# Patient Record
Sex: Female | Born: 1980 | Race: White | Hispanic: No | Marital: Married | Smoking: Current every day smoker
Health system: Southern US, Community
[De-identification: ages and names within clinical notes are randomized; demographics above are authoritative.]

## PROBLEM LIST (undated history)

## (undated) ENCOUNTER — Emergency Department (HOSPITAL_BASED_OUTPATIENT_CLINIC_OR_DEPARTMENT_OTHER): Payer: Medicaid Other

## (undated) DIAGNOSIS — E049 Nontoxic goiter, unspecified: Secondary | ICD-10-CM

## (undated) DIAGNOSIS — K115 Sialolithiasis: Secondary | ICD-10-CM

## (undated) DIAGNOSIS — D649 Anemia, unspecified: Secondary | ICD-10-CM

## (undated) DIAGNOSIS — E039 Hypothyroidism, unspecified: Secondary | ICD-10-CM

## (undated) DIAGNOSIS — G8929 Other chronic pain: Secondary | ICD-10-CM

## (undated) DIAGNOSIS — K635 Polyp of colon: Secondary | ICD-10-CM

## (undated) DIAGNOSIS — F329 Major depressive disorder, single episode, unspecified: Secondary | ICD-10-CM

## (undated) DIAGNOSIS — R42 Dizziness and giddiness: Secondary | ICD-10-CM

## (undated) DIAGNOSIS — F32A Depression, unspecified: Secondary | ICD-10-CM

## (undated) DIAGNOSIS — M549 Dorsalgia, unspecified: Secondary | ICD-10-CM

## (undated) DIAGNOSIS — F429 Obsessive-compulsive disorder, unspecified: Secondary | ICD-10-CM

## (undated) HISTORY — PX: ABDOMINAL HYSTERECTOMY: SHX81

## (undated) HISTORY — DX: Polyp of colon: K63.5

## (undated) HISTORY — DX: Hypothyroidism, unspecified: E03.9

## (undated) HISTORY — PX: WISDOM TOOTH EXTRACTION: SHX21

---

## 1999-12-14 ENCOUNTER — Inpatient Hospital Stay (HOSPITAL_COMMUNITY): Admission: AD | Admit: 1999-12-14 | Discharge: 1999-12-14 | Payer: Self-pay | Admitting: Obstetrics & Gynecology

## 2000-01-13 ENCOUNTER — Other Ambulatory Visit: Admission: RE | Admit: 2000-01-13 | Discharge: 2000-01-13 | Payer: Self-pay | Admitting: Obstetrics and Gynecology

## 2000-03-18 ENCOUNTER — Ambulatory Visit (HOSPITAL_COMMUNITY): Admission: RE | Admit: 2000-03-18 | Discharge: 2000-03-18 | Payer: Self-pay | Admitting: *Deleted

## 2000-04-16 ENCOUNTER — Inpatient Hospital Stay (HOSPITAL_COMMUNITY): Admission: AD | Admit: 2000-04-16 | Discharge: 2000-04-16 | Payer: Self-pay | Admitting: *Deleted

## 2000-05-03 ENCOUNTER — Inpatient Hospital Stay (HOSPITAL_COMMUNITY): Admission: AD | Admit: 2000-05-03 | Discharge: 2000-05-03 | Payer: Self-pay | Admitting: *Deleted

## 2000-05-13 ENCOUNTER — Ambulatory Visit (HOSPITAL_COMMUNITY): Admission: RE | Admit: 2000-05-13 | Discharge: 2000-05-13 | Payer: Self-pay | Admitting: *Deleted

## 2000-06-01 ENCOUNTER — Inpatient Hospital Stay (HOSPITAL_COMMUNITY): Admission: AD | Admit: 2000-06-01 | Discharge: 2000-06-01 | Payer: Self-pay | Admitting: Obstetrics

## 2000-06-02 ENCOUNTER — Inpatient Hospital Stay (HOSPITAL_COMMUNITY): Admission: AD | Admit: 2000-06-02 | Discharge: 2000-06-02 | Payer: Self-pay | Admitting: Obstetrics

## 2000-06-02 ENCOUNTER — Inpatient Hospital Stay (HOSPITAL_COMMUNITY): Admission: AD | Admit: 2000-06-02 | Discharge: 2000-06-02 | Payer: Self-pay | Admitting: Obstetrics & Gynecology

## 2000-06-05 ENCOUNTER — Inpatient Hospital Stay (HOSPITAL_COMMUNITY): Admission: AD | Admit: 2000-06-05 | Discharge: 2000-06-05 | Payer: Self-pay | Admitting: Obstetrics

## 2000-07-26 ENCOUNTER — Inpatient Hospital Stay (HOSPITAL_COMMUNITY): Admission: AD | Admit: 2000-07-26 | Discharge: 2000-07-28 | Payer: Self-pay | Admitting: Obstetrics

## 2000-10-03 ENCOUNTER — Inpatient Hospital Stay (HOSPITAL_COMMUNITY): Admission: AD | Admit: 2000-10-03 | Discharge: 2000-10-03 | Payer: Self-pay | Admitting: Obstetrics & Gynecology

## 2001-01-04 ENCOUNTER — Emergency Department (HOSPITAL_COMMUNITY): Admission: EM | Admit: 2001-01-04 | Discharge: 2001-01-04 | Payer: Self-pay | Admitting: Emergency Medicine

## 2001-01-10 ENCOUNTER — Other Ambulatory Visit: Admission: RE | Admit: 2001-01-10 | Discharge: 2001-01-10 | Payer: Self-pay | Admitting: *Deleted

## 2001-03-29 ENCOUNTER — Other Ambulatory Visit: Admission: RE | Admit: 2001-03-29 | Discharge: 2001-03-29 | Payer: Self-pay | Admitting: Obstetrics and Gynecology

## 2001-04-15 ENCOUNTER — Emergency Department (HOSPITAL_COMMUNITY): Admission: EM | Admit: 2001-04-15 | Discharge: 2001-04-15 | Payer: Self-pay | Admitting: Emergency Medicine

## 2001-04-21 ENCOUNTER — Ambulatory Visit (HOSPITAL_COMMUNITY): Admission: RE | Admit: 2001-04-21 | Discharge: 2001-04-21 | Payer: Self-pay | Admitting: Obstetrics and Gynecology

## 2001-04-21 HISTORY — PX: LASER ABLATION OF THE CERVIX: SHX1949

## 2001-04-28 ENCOUNTER — Encounter: Payer: Self-pay | Admitting: Emergency Medicine

## 2001-04-28 ENCOUNTER — Inpatient Hospital Stay (HOSPITAL_COMMUNITY): Admission: AC | Admit: 2001-04-28 | Discharge: 2001-04-30 | Payer: Self-pay

## 2001-08-25 ENCOUNTER — Emergency Department (HOSPITAL_COMMUNITY): Admission: EM | Admit: 2001-08-25 | Discharge: 2001-08-25 | Payer: Self-pay | Admitting: Emergency Medicine

## 2002-01-01 ENCOUNTER — Emergency Department (HOSPITAL_COMMUNITY): Admission: EM | Admit: 2002-01-01 | Discharge: 2002-01-02 | Payer: Self-pay | Admitting: Emergency Medicine

## 2002-01-03 ENCOUNTER — Emergency Department (HOSPITAL_COMMUNITY): Admission: EM | Admit: 2002-01-03 | Discharge: 2002-01-03 | Payer: Self-pay | Admitting: Emergency Medicine

## 2002-01-23 ENCOUNTER — Emergency Department (HOSPITAL_COMMUNITY): Admission: EM | Admit: 2002-01-23 | Discharge: 2002-01-23 | Payer: Self-pay | Admitting: Emergency Medicine

## 2002-03-15 ENCOUNTER — Emergency Department (HOSPITAL_COMMUNITY): Admission: EM | Admit: 2002-03-15 | Discharge: 2002-03-15 | Payer: Self-pay | Admitting: Emergency Medicine

## 2002-04-09 ENCOUNTER — Encounter: Admission: RE | Admit: 2002-04-09 | Discharge: 2002-04-09 | Payer: Self-pay | Admitting: Internal Medicine

## 2002-06-23 ENCOUNTER — Emergency Department (HOSPITAL_COMMUNITY): Admission: EM | Admit: 2002-06-23 | Discharge: 2002-06-23 | Payer: Self-pay | Admitting: Emergency Medicine

## 2002-08-03 ENCOUNTER — Emergency Department (HOSPITAL_COMMUNITY): Admission: EM | Admit: 2002-08-03 | Discharge: 2002-08-03 | Payer: Self-pay | Admitting: *Deleted

## 2002-08-09 ENCOUNTER — Inpatient Hospital Stay (HOSPITAL_COMMUNITY): Admission: AD | Admit: 2002-08-09 | Discharge: 2002-08-09 | Payer: Self-pay | Admitting: *Deleted

## 2002-11-24 ENCOUNTER — Emergency Department (HOSPITAL_COMMUNITY): Admission: EM | Admit: 2002-11-24 | Discharge: 2002-11-25 | Payer: Self-pay | Admitting: Emergency Medicine

## 2002-11-25 ENCOUNTER — Encounter: Payer: Self-pay | Admitting: Emergency Medicine

## 2003-03-21 ENCOUNTER — Emergency Department (HOSPITAL_COMMUNITY): Admission: EM | Admit: 2003-03-21 | Discharge: 2003-03-21 | Payer: Self-pay | Admitting: Emergency Medicine

## 2003-06-14 ENCOUNTER — Inpatient Hospital Stay (HOSPITAL_COMMUNITY): Admission: AD | Admit: 2003-06-14 | Discharge: 2003-06-14 | Payer: Self-pay | Admitting: *Deleted

## 2003-06-26 ENCOUNTER — Other Ambulatory Visit: Admission: RE | Admit: 2003-06-26 | Discharge: 2003-06-26 | Payer: Self-pay | Admitting: Obstetrics and Gynecology

## 2003-07-05 ENCOUNTER — Inpatient Hospital Stay (HOSPITAL_COMMUNITY): Admission: AD | Admit: 2003-07-05 | Discharge: 2003-07-05 | Payer: Self-pay | Admitting: Obstetrics and Gynecology

## 2003-08-31 ENCOUNTER — Inpatient Hospital Stay (HOSPITAL_COMMUNITY): Admission: AD | Admit: 2003-08-31 | Discharge: 2003-08-31 | Payer: Self-pay | Admitting: Obstetrics & Gynecology

## 2003-10-17 ENCOUNTER — Inpatient Hospital Stay (HOSPITAL_COMMUNITY): Admission: AD | Admit: 2003-10-17 | Discharge: 2003-10-17 | Payer: Self-pay | Admitting: Obstetrics and Gynecology

## 2003-11-17 ENCOUNTER — Inpatient Hospital Stay (HOSPITAL_COMMUNITY): Admission: AD | Admit: 2003-11-17 | Discharge: 2003-11-17 | Payer: Self-pay | Admitting: Obstetrics and Gynecology

## 2003-12-19 ENCOUNTER — Inpatient Hospital Stay (HOSPITAL_COMMUNITY): Admission: AD | Admit: 2003-12-19 | Discharge: 2003-12-21 | Payer: Self-pay | Admitting: Obstetrics and Gynecology

## 2003-12-23 ENCOUNTER — Inpatient Hospital Stay (HOSPITAL_COMMUNITY): Admission: AD | Admit: 2003-12-23 | Discharge: 2003-12-23 | Payer: Self-pay | Admitting: Obstetrics and Gynecology

## 2004-01-13 ENCOUNTER — Observation Stay (HOSPITAL_COMMUNITY): Admission: AD | Admit: 2004-01-13 | Discharge: 2004-01-13 | Payer: Self-pay | Admitting: Obstetrics and Gynecology

## 2004-01-29 ENCOUNTER — Inpatient Hospital Stay (HOSPITAL_COMMUNITY): Admission: AD | Admit: 2004-01-29 | Discharge: 2004-01-31 | Payer: Self-pay | Admitting: Obstetrics and Gynecology

## 2005-02-06 ENCOUNTER — Emergency Department (HOSPITAL_COMMUNITY): Admission: EM | Admit: 2005-02-06 | Discharge: 2005-02-06 | Payer: Self-pay | Admitting: *Deleted

## 2005-05-20 ENCOUNTER — Other Ambulatory Visit: Admission: RE | Admit: 2005-05-20 | Discharge: 2005-05-20 | Payer: Self-pay | Admitting: Obstetrics and Gynecology

## 2005-07-29 ENCOUNTER — Other Ambulatory Visit: Admission: RE | Admit: 2005-07-29 | Discharge: 2005-07-29 | Payer: Self-pay | Admitting: Obstetrics and Gynecology

## 2005-10-04 ENCOUNTER — Encounter: Admission: RE | Admit: 2005-10-04 | Discharge: 2005-10-04 | Payer: Self-pay | Admitting: Nephrology

## 2005-11-11 ENCOUNTER — Other Ambulatory Visit: Admission: RE | Admit: 2005-11-11 | Discharge: 2005-11-11 | Payer: Self-pay | Admitting: Obstetrics and Gynecology

## 2005-12-05 ENCOUNTER — Emergency Department (HOSPITAL_COMMUNITY): Admission: EM | Admit: 2005-12-05 | Discharge: 2005-12-05 | Payer: Self-pay | Admitting: Emergency Medicine

## 2006-05-14 ENCOUNTER — Emergency Department (HOSPITAL_COMMUNITY): Admission: EM | Admit: 2006-05-14 | Discharge: 2006-05-14 | Payer: Self-pay | Admitting: Emergency Medicine

## 2006-06-03 ENCOUNTER — Other Ambulatory Visit: Admission: RE | Admit: 2006-06-03 | Discharge: 2006-06-03 | Payer: Self-pay | Admitting: Obstetrics and Gynecology

## 2007-07-15 ENCOUNTER — Inpatient Hospital Stay (HOSPITAL_COMMUNITY): Admission: AD | Admit: 2007-07-15 | Discharge: 2007-07-15 | Payer: Self-pay | Admitting: Obstetrics and Gynecology

## 2007-10-29 ENCOUNTER — Inpatient Hospital Stay (HOSPITAL_COMMUNITY): Admission: AD | Admit: 2007-10-29 | Discharge: 2007-10-29 | Payer: Self-pay | Admitting: Obstetrics and Gynecology

## 2007-11-18 ENCOUNTER — Inpatient Hospital Stay (HOSPITAL_COMMUNITY): Admission: AD | Admit: 2007-11-18 | Discharge: 2007-11-18 | Payer: Self-pay | Admitting: Obstetrics and Gynecology

## 2007-12-29 ENCOUNTER — Inpatient Hospital Stay (HOSPITAL_COMMUNITY): Admission: AD | Admit: 2007-12-29 | Discharge: 2007-12-30 | Payer: Self-pay | Admitting: Obstetrics and Gynecology

## 2008-01-29 ENCOUNTER — Inpatient Hospital Stay (HOSPITAL_COMMUNITY): Admission: AD | Admit: 2008-01-29 | Discharge: 2008-01-29 | Payer: Self-pay | Admitting: Obstetrics and Gynecology

## 2008-02-16 ENCOUNTER — Inpatient Hospital Stay (HOSPITAL_COMMUNITY): Admission: RE | Admit: 2008-02-16 | Discharge: 2008-02-18 | Payer: Self-pay | Admitting: Obstetrics and Gynecology

## 2008-05-11 ENCOUNTER — Emergency Department (HOSPITAL_COMMUNITY): Admission: EM | Admit: 2008-05-11 | Discharge: 2008-05-12 | Payer: Self-pay | Admitting: Emergency Medicine

## 2008-09-27 ENCOUNTER — Emergency Department (HOSPITAL_COMMUNITY): Admission: EM | Admit: 2008-09-27 | Discharge: 2008-09-27 | Payer: Self-pay | Admitting: Emergency Medicine

## 2008-10-01 ENCOUNTER — Emergency Department (HOSPITAL_COMMUNITY): Admission: EM | Admit: 2008-10-01 | Discharge: 2008-10-01 | Payer: Self-pay | Admitting: Family Medicine

## 2008-10-21 ENCOUNTER — Ambulatory Visit (HOSPITAL_COMMUNITY): Payer: Self-pay | Admitting: Psychology

## 2008-11-21 ENCOUNTER — Emergency Department (HOSPITAL_COMMUNITY): Admission: EM | Admit: 2008-11-21 | Discharge: 2008-11-22 | Payer: Self-pay | Admitting: Emergency Medicine

## 2008-11-23 ENCOUNTER — Emergency Department (HOSPITAL_BASED_OUTPATIENT_CLINIC_OR_DEPARTMENT_OTHER): Admission: EM | Admit: 2008-11-23 | Discharge: 2008-11-23 | Payer: Self-pay | Admitting: Emergency Medicine

## 2008-11-23 ENCOUNTER — Ambulatory Visit: Payer: Self-pay | Admitting: Diagnostic Radiology

## 2008-11-25 ENCOUNTER — Ambulatory Visit (HOSPITAL_BASED_OUTPATIENT_CLINIC_OR_DEPARTMENT_OTHER): Admission: RE | Admit: 2008-11-25 | Discharge: 2008-11-25 | Payer: Self-pay | Admitting: Emergency Medicine

## 2008-11-25 ENCOUNTER — Ambulatory Visit: Payer: Self-pay | Admitting: Diagnostic Radiology

## 2008-12-10 ENCOUNTER — Emergency Department (HOSPITAL_COMMUNITY): Admission: EM | Admit: 2008-12-10 | Discharge: 2008-12-10 | Payer: Self-pay | Admitting: Emergency Medicine

## 2009-01-16 ENCOUNTER — Encounter: Admission: RE | Admit: 2009-01-16 | Discharge: 2009-01-16 | Payer: Self-pay | Admitting: Obstetrics and Gynecology

## 2009-01-22 ENCOUNTER — Emergency Department (HOSPITAL_BASED_OUTPATIENT_CLINIC_OR_DEPARTMENT_OTHER): Admission: EM | Admit: 2009-01-22 | Discharge: 2009-01-23 | Payer: Self-pay | Admitting: Emergency Medicine

## 2009-02-11 ENCOUNTER — Ambulatory Visit (HOSPITAL_COMMUNITY): Admission: RE | Admit: 2009-02-11 | Discharge: 2009-02-11 | Payer: Self-pay | Admitting: Obstetrics and Gynecology

## 2009-02-11 HISTORY — PX: TUBAL LIGATION: SHX77

## 2009-03-31 ENCOUNTER — Emergency Department (HOSPITAL_COMMUNITY): Admission: EM | Admit: 2009-03-31 | Discharge: 2009-03-31 | Payer: Self-pay | Admitting: Family Medicine

## 2010-03-01 ENCOUNTER — Emergency Department (HOSPITAL_COMMUNITY): Admission: EM | Admit: 2010-03-01 | Discharge: 2010-03-01 | Payer: Self-pay | Admitting: Emergency Medicine

## 2010-05-22 ENCOUNTER — Emergency Department (HOSPITAL_COMMUNITY)
Admission: EM | Admit: 2010-05-22 | Discharge: 2010-05-23 | Payer: Self-pay | Source: Home / Self Care | Admitting: Emergency Medicine

## 2010-05-24 ENCOUNTER — Emergency Department (HOSPITAL_COMMUNITY): Admission: EM | Admit: 2010-05-24 | Discharge: 2010-05-24 | Payer: Self-pay | Admitting: Emergency Medicine

## 2010-11-12 LAB — DIFFERENTIAL
Lymphocytes Relative: 39 % (ref 12–46)
Lymphs Abs: 2.9 10*3/uL (ref 0.7–4.0)
Monocytes Relative: 5 % (ref 3–12)
Neutro Abs: 3.9 10*3/uL (ref 1.7–7.7)
Neutrophils Relative %: 52 % (ref 43–77)

## 2010-11-12 LAB — CBC
HCT: 37.9 % (ref 36.0–46.0)
MCV: 84.4 fL (ref 78.0–100.0)
Platelets: 200 10*3/uL (ref 150–400)
RBC: 4.49 MIL/uL (ref 3.87–5.11)
WBC: 7.4 10*3/uL (ref 4.0–10.5)

## 2010-11-12 LAB — BASIC METABOLIC PANEL
Calcium: 9.1 mg/dL (ref 8.4–10.5)
Chloride: 111 mEq/L (ref 96–112)
Creatinine, Ser: 0.6 mg/dL (ref 0.4–1.2)
GFR calc non Af Amer: >60 mL/min (ref >60)
Glucose, Bld: 101 mg/dL — ABNORMAL HIGH (ref 70–99)

## 2010-11-15 LAB — RAPID STREP SCREEN (MED CTR MEBANE ONLY): Streptococcus, Group A Screen (Direct): NEGATIVE

## 2010-12-07 LAB — CBC: Hemoglobin: 13.4 g/dL (ref 12.0–15.0)

## 2010-12-09 LAB — URINALYSIS, ROUTINE W REFLEX MICROSCOPIC
Bilirubin Urine: NEGATIVE
Glucose, UA: NEGATIVE mg/dL
Hgb urine dipstick: NEGATIVE
Protein, ur: NEGATIVE mg/dL
pH: 5.5 (ref 5.0–8.0)

## 2010-12-10 LAB — CBC
HCT: 42.4 % (ref 36.0–46.0)
Hemoglobin: 14.5 g/dL (ref 12.0–15.0)
MCV: 83.3 fL (ref 78.0–100.0)
Platelets: 217 10*3/uL (ref 150–400)
RBC: 5.09 MIL/uL (ref 3.87–5.11)
RDW: 12.5 % (ref 11.5–15.5)

## 2010-12-10 LAB — URINALYSIS, ROUTINE W REFLEX MICROSCOPIC
Hgb urine dipstick: NEGATIVE
Protein, ur: NEGATIVE mg/dL
Specific Gravity, Urine: 1.036 — ABNORMAL HIGH (ref 1.005–1.030)
pH: 6 (ref 5.0–8.0)

## 2010-12-10 LAB — COMPREHENSIVE METABOLIC PANEL
Albumin: 4.1 g/dL (ref 3.5–5.2)
BUN: 8 mg/dL (ref 6–23)
CO2: 21 mEq/L (ref 19–32)
Chloride: 107 mEq/L (ref 96–112)
Creatinine, Ser: 0.73 mg/dL (ref 0.4–1.2)
GFR calc non Af Amer: >60 mL/min (ref >60)
Potassium: 3.6 mEq/L (ref 3.5–5.1)
Sodium: 137 mEq/L (ref 135–145)
Total Protein: 6.9 g/dL (ref 6.0–8.3)

## 2010-12-10 LAB — URINE MICROSCOPIC-ADD ON

## 2010-12-10 LAB — DIFFERENTIAL
Basophils Absolute: 0.1 10*3/uL (ref 0.0–0.1)
Basophils Relative: 1 % (ref 0–1)
Eosinophils Absolute: 0.1 10*3/uL (ref 0.0–0.7)

## 2010-12-10 LAB — POCT PREGNANCY, URINE: Preg Test, Ur: NEGATIVE

## 2011-01-12 NOTE — H&P (Signed)
Patricia, Olson                  ACCOUNT NO.:  [id number]   MEDICAL RECORD NO.:  [id number]          PATIENT TYPE:  INP   LOCATION:  9165                          FACILITY:  [facility]   PHYSICIAN:  Osborn Coho, M.D.   DATE OF BIRTH:  1980/09/25   DATE OF ADMISSION:  02/16/2008  DATE OF DISCHARGE:                              HISTORY & PHYSICAL   Ms. Foley is a 30 year old gravida 4, para 3-0-0-3 at 39-2/7 weeks, who  presents for induction secondary to elective desire and favorable  cervix, was 3 cm in the office yesterday.  Pregnancy has been remarkable  for:  1. Positive group B strep.  2. First trimester BV.  3. Depression with the patient on Zoloft during pregnancy.  4. History of abnormal Pap with normal findings during this pregnancy.  5. Smoker.  6. History of STDs.  7. First two children adopted out.  8. Questionable history of bipolar disease.   PRENATAL LABS:  Blood type is A+, Rh antibody negative, VDRL  nonreactive, rubella titer positive, hepatitis B surface antigen  negative, HIV nonreactive, GC and chlamydia cultures were negative in  October and at 36 weeks.  Cystic fibrosis testing was negative.  HSV II  testing was negative.  Pap was normal in September 2008.  Hemoglobin  upon entering the practice was 12.9, it was within normal limits at 28  weeks.  First trimester screen was normal.  AFP was normal.  TSH was  checked at 19 weeks and was normal, this was done secondary to fatigue.  The patient had a normal Glucola at 29 weeks.  RPR was nonreactive.  Group B strep culture was positive at 36 weeks.   HISTORY OF PRESENT PREGNANCY:  The patient entered care at approximately  6 weeks.  She was treated for BV at that time.  She had GC and chlamydia  cultures done at that time.  She was recommended to wean off Celexa and  was suggested to start on Zoloft.  She was seen at maternity admissions  at 8 weeks for pain.  She was treated again for BV at 11 weeks.  She had  an ultrasound at that time for viability.  She did not initially want to  use Zoloft, and she refused a referral to a mental health Fayne Mcguffee.  First trimester screen was normal.  AFP was normal.  The patient did  decide she wanted a tubal.  It appears these were signed during her  pregnancy, but there is not a copy of them on her chart.  The patient  was seen at maternity admissions unit multiple times during her  pregnancy.  She was on Zoloft by approximately 21 weeks.  She reviewed  with [place] at about 22 weeks her history of diagnosis of bipolar as a  child.  Zoloft then began to help.  She was evaluated for strep throat  at 24 weeks, which was negative.  She was treated for external use at 26-  27 weeks.  The patient had an ultrasound at 29 weeks showing normal  growth.  This was  done for follow-up on vaginal bleeding, which she was  seen at maternity admissions.  Glucola was normal.  Zoloft was up to 75  mg per day at 31 weeks.  She had several more visits to maternity  admissions during the rest of her pregnancy.  She did begin to talk  about desiring induction due to emotional distress.  The risks and  benefits of this were reviewed with the patient by Dr. Su Hilt, and she  was scheduled for today with risks and benefits expressed to the  patient.   OBSTETRICAL HISTORY:  In 1999, she had a vaginal birth of a female  infant, weight 7 pounds at 40 weeks.  She did not remembers how long she  was in labor.  She had no anesthesia.  That child was born in [place], and  that child was adopted out.  In 2001, she had a vaginal birth of a female  infant, weight 8 pounds at approximately 40 weeks.  She had no  complications.  That child was also adopted out.  In 2005, she had a  vaginal birth of a female infant, weight 8 pounds 12 ounces at 40 weeks.  She was in labor an unknown period of time.  That child was born in  [place], and she does have custody of that child.  She was on iron  during her  pregnancy.   MEDICAL HISTORY:  She is a previous NuvaRing user.  In 2008, she had an  abnormal Pap and had a colposcopy, everything else was fine.  She was  treated for Chlamydia in 1998.  She was treated for [facility] in 2001.  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  She has had recurrent sporadic  recurrent yeast infections and bacterial infections.  She has occasional  bladder infections.  She does have a history of depression.  She was on  Celexa prior to pregnancy.  She also says now that she was diagnosed  with bipolar disease when she was a child.   SURGICAL HISTORY:  1. Wisdom teeth.  2. Surgery that burned a precancerous off the cervix in 2000.   ALLERGIES:  NONE.   FAMILY HISTORY:  The patient's maternal grandfather had possible  diabetes.  Maternal grandmother had breast cancer, then bone cancer.  Her mother has history of depression.  Genetic history is unremarkable.   SOCIAL HISTORY:  The patient is single.  The father of baby has been  present with her sporadically, his name is Raygen Linquist.  The patient is  Caucasian.  She denies any religious affiliation.  She has 1 year of  college.  She is unemployed.  Her partner has a high school education,  he is currently employed.  She has been followed by the physician  service at [facility].  She denies any alcohol or drug use  during this pregnancy.  She has been a one pack per day smoker.   PHYSICAL EXAMINATION:  VITAL SIGNS:  Stable.  The patient is afebrile.  HEENT:  Within normal limits.  LUNGS:  Breath sounds are clear.  HEART:  Regular rate and rhythm without murmur.  BREASTS:  Soft and nontender.  ABDOMEN:  Fundal height is approximately 38 cm.  Estimated fetal weight  is 8 pounds.  Uterine contractions are irregular and mild.  Fetal heart  rate is reassuring.  Cervix was 3 cm in the office by Dr. Su Hilt  yesterday.  A repeat exam is deferred at this  time.  EXTREMITIES:  Deep tendon reflexes are 2+ without  clonus.  There is  trace edema noted.   IMPRESSION:  1. Intrauterine pregnancy at 39-2/7 weeks.  2. Desires elective induction.  3. Favorable cervix.  4. Positive group B strep.  5. Desires tubal per her record.   PLAN:  1. Admit to birthing suite and consult Dr. Su Hilt, who is the      attending physician.  2. Routine physician orders.  3. Plan Pitocin induction, artificial rupture of membranes if      appropriate and pain meds p.r.n.  4. Group B strep treatment with penicillin G per standard dosing.  5. MDs will follow.  6. I will obtain the tubal papers from the office and confirm with the      patient that she does wish this.  7. The risks and benefits of elective induction reviewed with the      patient, including failure of method, prolonged labor, and need for      cesarean section.  The patient seems to understand these risks and      still wishes to proceed.      Renaldo Reel Emilee Hero, C.N.M.      Osborn Coho, M.D.  Electronically Signed    VLL/MEDQ  D:  02/16/2008  T:  02/16/2008  Job:  [id number]

## 2011-01-12 NOTE — Op Note (Signed)
Patricia Olson, Patricia Olson                  ACCOUNT NO.:  [id number]   MEDICAL RECORD NO.:  [id number]          PATIENT TYPE:  AMB   LOCATION:  SDC                           [facility]  [facility]   PHYSICIAN:  Osborn Coho, M.D.   DATE OF BIRTH:  1981/08/13   DATE OF PROCEDURE:  02/11/2009  DATE OF DISCHARGE:  02/11/2009                               OPERATIVE REPORT   PREOPERATIVE DIAGNOSIS:  Desires permanent sterilization.   POSTOPERATIVE DIAGNOSIS:  Desires permanent sterilization.   PROCEDURE:  Laparoscopic bilateral fulguration.   ATTENDING DOCTOR:  Osborn Coho, MD   ANESTHESIA:  General.   FLUIDS:  1500 mL.   URINE OUTPUT:  Quantity sufficient via straight cath prior to procedure.   ESTIMATED BLOOD LOSS:  Minimal.   COMPLICATIONS:  None.   PROCEDURE:  The patient was taken to the operating room after risks,  benefits, and alternatives discussed with the patient.  The patient  verbalized understanding and consent signed and witnessed.  The patient  was placed under general anesthesia and prepped and draped in the normal  sterile fashion in the dorsal lithotomy position.  A bivalve speculum  was placed in the patient's vagina and a Hulka tenaculum placed for  intrauterine manipulation.  Attention was then turned to the abdomen  after going and resolving and a 10-mm umbilical incision was made and  the Veress needle introduced into the intra-abdominal cavity and  pneumoperitoneum achieved.  This was done after injection of 10 mL of  0.5% Marcaine.  The 10-mm trocar was then advanced into the intra-  abdominal cavity without difficulty and laparoscope introduced and  normal bilateral ovaries and fallopian tubes noted.  There were no  adhesions noted as well.  The bilateral fallopian tubes were carried out  to their fimbriated end and in the isthmic portion for at least 3  consecutive burns.  Each fallopian tube was cauterized with the bipolar  Kleppinger.  This was done while the  patient was in Trendelenburg.  The  patient was then taken out of Trendelenburg and the pneumoperitoneum was  relieved.  The trocar was removed under direct visualization and the  fascia was repaired with 0 Vicryl via a figure-of-eight  stitch.  The skin was reapproximated using 3-0 Monocryl via a  subcuticular stitch.  Dermabond was applied on the incision.  The Hulka  tenaculum was removed.  Sponge, lap, and needle count was correct.  The  patient tolerated the procedure well and is currently being transferred  to the recovery room in good condition.      Osborn Coho, M.D.  Electronically Signed     AR/MEDQ  D:  02/11/2009  T:  02/12/2009  Job:  [id number]

## 2011-01-15 NOTE — Discharge Summary (Signed)
[facility]  Patient:    Patricia Olson, Patricia Olson                         MRN: [id number] Adm. Date:  [id number] Disc. Date: [id number] Attending:  Tammi Sou Dictator:   Zella Ball, M.D.                           Discharge Summary  DATE OF BIRTH:                     September 20, 1980  DISCHARGE DIAGNOSES:               1. Intrauterine pregnancy - delivered.                                    2. Child put up for adoption.  PROCEDURES WHILE IN HOSPITAL:      None.  CONSULTATIONS WHILE IN HOSPITAL:   None.  HISTORY OF PRESENT ILLNESS:        This is a 30 year old, G2, P1-0-0-1 who presented to [facility] at 38 weeks 6 days dated by a 21-week ultrasound who had had spontaneous rupture of membranes roughly one hour prior to presentation was in active labor.  Patient was admitted for delivery of the child who both parents (both the patient and the father of the baby) were there and stated that the patient was to be adopted out and that the adoptive parents were contacted while Ms. Foley was in labor.  OBSTETRICAL HISTORY:               Patient had a normal spontaneous vaginal delivery of a female in April 1999.  GYNECOLOGICAL HISTORY:             Patient had had an abnormal Pap smear previously in the pregnancy and needs to have a followup colposcopy.  PRENATAL HISTORY:                  Negative for HIV, GC and Chlamydia as well as VDRL and GBS negative.  Patient was rubella nonimmune and had a blood type of A positive.  PRESENTING PHYSICAL EXAMINATION:   Patient was 5 cm dilated, 100% effaced and infant was +1 vertex with baseline fetal heart rate in the 140s, positive accels, no decels, uterine contraction every three minutes.  HOSPITAL COURSE:                   Patient was admitted to labor and delivery where she was delivered of a viable female infant Apgars 9 at one minute and 8 at five minutes with an estimated fetal weight of 8 pounds 2  ounces. Delivery was uncomplicated and without anesthesia.  Following this, patient had a normal postnatal course.  Patient opted not to see the infant who was taken by the adoptive parents and patient essentially had no other complications.  DISCHARGE LABORATORIES:            Patient had a hemoglobin of 8.0 after her delivery and was sent home on oral iron.  DISCHARGE MEDICATIONS:             Iron and Tylox and Tylenol for pain.  FOLLOWUP:  The patient was to follow up in six weeks at [facility]. DD:  09/01/00 TD:  09/01/00 Job: [id number]

## 2011-01-15 NOTE — Op Note (Signed)
[facility]  Patient:    Patricia Olson, Patricia Olson Visit Number: [id number] MRN: [id number]          Service Type: DSU Location: [facility] Attending Physician:  Wandalee Ferdinand Proc. Date: 04/21/01 Adm. Date:  04/21/2001 Disc. Date: 04/21/2001                             Operative Report  PREOPERATIVE DIAGNOSES:       1. Cervical intraepithelial neoplasia grade III.                               2. Large geographic lesion.                               3. Adequate colposcopy.  POSTOPERATIVE DIAGNOSES:      1. Cervical intraepithelial neoplasia grade III.                               2. Large geographic lesion.                               3. Adequate colposcopy.  PROCEDURE:                    Laser vaporization of the cervix.  SURGEON:                      Rudy Jew. Ashley Royalty, M.D.  ANESTHESIA:                   General.  ESTIMATED BLOOD LOSS:         50 cc.  COMPLICATIONS:                None.  PACKS AND DRAINS:             None.  PROCEDURE:                    Patient was taken to the operating room and placed in the dorsal supine position.  After adequate general anesthesia was administered galvanized speculum was placed per vagina.  The cervix was visualized through the laser colposcope.  There were no aceto negative lesions noted.  The cervix was then bathed with 5% acetic acid, revisualized through the colposcope.  The previously mentioned large geographic area of white epithelium was once again encountered.  It was mostly anterior 3-9 oclock. Using the laser at 10-15 watts power and medium spot size the entire transformation zone including all of the abnormal epithelium was circumscribed with the laser using the pulse technique.  Next, a continuous mode was used in order to circumscribe the entire area in a circular fashion.  The area to be vaporized was then divided into four quadrants.  Posterior quadrants were vaporized first.  Each  quadrant was vaporized down to a level of 5-7 mm which was verified individually with the dipstick.  Finally, the anterior quadrants were vaporized.  The D-focus beam was used in order to provide adequate coagulation.  It should be mentioned that approximately 10 cc of Xylocaine with 1:100,000 epinephrine was used intraoperatively to aid in hemostasis. After the D-focus beam had been employed hemostasis was noted.  The cervix was bathed with Monsels solution and the procedure terminated.  Patient tolerated procedure extremely well and was returned to the recovery room in good condition.Attending Physician:  Wandalee Ferdinand DD:  04/21/01 TD:  04/23/01 Job: [id number]
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
## 2011-01-15 NOTE — Discharge Summary (Signed)
NAME:  Patricia Olson, Patricia Olson                            ACCOUNT NO.:  [id number]   MEDICAL RECORD NO.:  [id number]                   PATIENT TYPE:  INP   LOCATION:  9156                                 [facility]ILITY[facility]  [facility]   PHYSICIAN:  Randye Lobo, M.D.                DATE OF BIRTH:  11-18-1980   DATE OF ADMISSION:  12/19/2003  DATE OF DISCHARGE:  12/21/2003                                 DISCHARGE SUMMARY   ADMISSION DIAGNOSES:  1. Intrauterine gestation at 32 weeks.  2. Preterm labor.   DISCHARGE DIAGNOSES:  1. Thirty-two plus [redacted] weeks gestation, undelivered.  2. Preterm labor.  3. Vulvar candidiasis.   ADMISSION HISTORY AND PHYSICAL EXAMINATION:  The patient is a 30 year old  gravida 3, para 2-0-02 Caucasian female at [redacted] weeks gestation with an [facility] of  February 13, 2004, who presented with pressure symptoms since December 18, 2003  along with contractions.  The patient's medical history was significant for  a LEEP procedure.  She also does have a history of preterm labor with a  prior pregnancy.  On physical examination, the patient was noted to have a  cervix which was 2 to 3 cm dilated, 50% effaced, and with a vertex at a  minus 2 station.   The patient was diagnosed with preterm labor and she was admitted for a  course of betamethasone steroids, magnesium sulfate, antibiotic prophylaxis  for group B strep.  The patient did receive the magnesium sulfate for 48  hours.  She received a course of the betamethasone 12.5 mg IM q.24 h. x2  doses.  The patient did remain stable during this period of time and  demonstrated no cervical change.  She was re-examined on the evening of  December 20, 2003 at which time she was reporting some back discomfort.  Her  cervix was noted to be 2 cm dilated and 70% effaced with a vertex  ballotable.   The patient was treated with penicillin for group B strep prophylaxis.  I am  not aware of any group B strep cultures that may have been performed in the  office  prior to her admission.   The patient did have an obstetric ultrasound performed on December 19, 2003  which documented an estimated fetal weight of 2251 grams which was between  the 90 and 95th percentile.  The amniotic fluid index was 18.3 cm.  The  position of the fetus was vertex and the placenta was noted to be posterior  with no previa.  The cervix measured 2.4 cm in total length.   On the evening of December 20, 2003, the patient reported itching of the labia  and demonstrated concern about potential STD exposure due to her symptoms  with itching.  A sterile speculum examination was performed and GC and  Chlamydia cultures were taken.  A wet prep was performed as well.  Ultimately, the  GC and Chlamydia were negative and the wet prep showed a  mild amount of yeast.  The patient was treated with Monistat cream  externally.   The patient was found to be in good condition and ready for discharge in the  morning of December 21, 2003.  She had been contracting very rarely.  The  patient is given Procardia 10 mg p.o. x1 and her magnesium sulfate is  discontinued.  She will be observed for contractions and for a reactive  fetal heart rate tracing prior to discharge.  If she is stable, she will be  discharged today.   INSTRUCTIONS AT DISCHARGE:  1. Discharged to home.  2. The patient will be on bed rest and pelvic rest.  3. She will take a regular diet.  4. The patient will take Procardia 10 mg p.o. q.8 h.  She will also finish     her prescription for Monistat 7 externally q.h.s. x7 days.  5. The patient will follow up in the office in 1 week.  6. The patient will call if she experiences frequent contractions, leaking,     fluid from the vagina, vaginal bleeding, abdominal pain, or decreased     fetal movement.                                               Randye Lobo, M.D.    BES/MEDQ  D:  12/21/2003  T:  12/21/2003  Job:  [id number]

## 2011-01-15 NOTE — H&P (Signed)
[facility]  Patient:    Patricia Olson, Patricia Olson Visit Number: [id number] MRN: [id number]          Service Type: EMS Location: ED Attending Physician:  Doug Sou Adm. Date:  04/15/2001 Disc. Date: 04/15/2001                           History and Physical  HISTORY:                      This is a 30 year old gravida 2, para 2 who presented March 28, 2001 for evaluation of an abnormal Pap smear performed Jan 10, 2001 at another physicians office.  The Pap smear returned showing CIN II.  Subsequent colposcopy was performed March 29, 2001.  The colposcopy revealed a large geographic lesion essentially covering the entire anterior aspect of the cervix.  The colposcopy was considered adequate. Colposcopically directed biopsies revealed CIN III.  Patient is hence for laser vaporization of the cervix.  MEDICATIONS:                  None.  PAST MEDICAL HISTORY:         None.  PAST SURGICAL HISTORY:        None.  ALLERGIES:                    None.  FAMILY HISTORY:               Positive for hypertension, diabetes, and breast cancer.  SOCIAL HISTORY:               The patient smokes two and one-half packs of cigarettes per day.  She drinks alcohol rarely.  REVIEW OF SYSTEMS:            Noncontributory.  PHYSICAL EXAMINATION  GENERAL:                      Well-developed, well-nourished, pleasant female in no acute distress.  VITAL SIGNS:                  Afebrile with vital signs stable.  SKIN:                         Warm and dry without lesions.  LYMPH:                        There is no supraclavicular, cervical, or inguinal adenopathy.  HEENT:                        Normocephalic.  NECK:                         Supple without thyromegaly.  CHEST:                        Lungs are clear.  CARDIAC:                      Regular rate and rhythm without murmurs, gallops, or rubs.  BREASTS:                      Deferred.  ABDOMEN:  Soft, nontender without masses or organomegaly. Bowel sounds are active.  MUSCULOSKELETAL:              Full range of motion without edema, cyanosis, or CVA tenderness.  PELVIC:                       Deferred until examination under anesthesia.  IMPRESSION:                   1. 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                               2. Large geographic lesion.                               3. Status post adequate colposcopy.                               4. Smoker.  PLAN:                         Laser vaporization of the cervix.  Risks, benefits, complications, and alternatives fully discussed with the patient. She states she understands and accepts.  Questions invited and answered. Attending Physician:  Doug Sou DD:  04/21/01 TD:  04/21/01 Job: [id number]

## 2011-05-24 LAB — URINALYSIS, ROUTINE W REFLEX MICROSCOPIC
Bilirubin Urine: NEGATIVE
Bilirubin Urine: NEGATIVE
Glucose, UA: NEGATIVE
Ketones, ur: 15 — AB
Ketones, ur: 15 — AB
Nitrite: NEGATIVE
Protein, ur: NEGATIVE
Protein, ur: NEGATIVE
Urobilinogen, UA: 0.2
Urobilinogen, UA: 0.2
pH: 5.5

## 2011-05-24 LAB — URINE CULTURE: Culture: NO GROWTH

## 2011-05-24 LAB — GC/CHLAMYDIA PROBE AMP, GENITAL: GC Probe Amp, Genital: NEGATIVE

## 2011-05-24 LAB — WET PREP, GENITAL
Clue Cells Wet Prep HPF POC: NONE SEEN
Yeast Wet Prep HPF POC: NONE SEEN

## 2011-05-24 LAB — URINE MICROSCOPIC-ADD ON

## 2011-05-26 LAB — URINE MICROSCOPIC-ADD ON

## 2011-05-26 LAB — URINE CULTURE

## 2011-05-26 LAB — URINALYSIS, ROUTINE W REFLEX MICROSCOPIC
Glucose, UA: NEGATIVE
Ketones, ur: 15 — AB
Protein, ur: NEGATIVE
Urobilinogen, UA: 0.2

## 2011-05-26 LAB — GC/CHLAMYDIA PROBE AMP, GENITAL
Chlamydia, DNA Probe: NEGATIVE
GC Probe Amp, Genital: NEGATIVE

## 2011-05-26 LAB — STREP B DNA PROBE

## 2011-05-26 LAB — FETAL FIBRONECTIN: Fetal Fibronectin: NEGATIVE

## 2011-05-27 LAB — CBC
HCT: 31.2 — ABNORMAL LOW
HCT: 34.6 — ABNORMAL LOW
Hemoglobin: 10.4 — ABNORMAL LOW
Hemoglobin: 11.8 — ABNORMAL LOW
MCHC: 33.4
MCHC: 34.1
MCV: 82.8
MCV: 83.2
Platelets: 209
Platelets: 218
RBC: 3.77 — ABNORMAL LOW
RBC: 4.16
RDW: 13.1
RDW: 13.1
WBC: 11.7 — ABNORMAL HIGH
WBC: 14.3 — ABNORMAL HIGH

## 2011-06-02 LAB — URINE MICROSCOPIC-ADD ON

## 2011-06-02 LAB — WET PREP, GENITAL: Yeast Wet Prep HPF POC: NONE SEEN

## 2011-06-02 LAB — URINALYSIS, ROUTINE W REFLEX MICROSCOPIC
Glucose, UA: NEGATIVE
Ketones, ur: NEGATIVE
pH: 7

## 2011-06-02 LAB — GC/CHLAMYDIA PROBE AMP, GENITAL
Chlamydia, DNA Probe: NEGATIVE
GC Probe Amp, Genital: NEGATIVE

## 2011-06-02 LAB — POCT PREGNANCY, URINE: Preg Test, Ur: NEGATIVE

## 2011-06-08 LAB — URINALYSIS, ROUTINE W REFLEX MICROSCOPIC
Protein, ur: NEGATIVE
Urobilinogen, UA: 0.2

## 2011-06-08 LAB — URINE MICROSCOPIC-ADD ON

## 2012-02-28 ENCOUNTER — Ambulatory Visit (INDEPENDENT_AMBULATORY_CARE_PROVIDER_SITE_OTHER): Payer: Medicaid Other | Admitting: Obstetrics and Gynecology

## 2012-02-28 ENCOUNTER — Encounter: Payer: Self-pay | Admitting: Obstetrics and Gynecology

## 2012-02-28 VITALS — BP 118/70 | HR 70 | Ht 66.0 in | Wt 170.0 lb

## 2012-02-28 DIAGNOSIS — F411 Generalized anxiety disorder: Secondary | ICD-10-CM

## 2012-02-28 DIAGNOSIS — Z0000 Encounter for general adult medical examination without abnormal findings: Secondary | ICD-10-CM

## 2012-02-28 DIAGNOSIS — F419 Anxiety disorder, unspecified: Secondary | ICD-10-CM

## 2012-02-28 DIAGNOSIS — Z01419 Encounter for gynecological examination (general) (routine) without abnormal findings: Secondary | ICD-10-CM

## 2012-02-28 DIAGNOSIS — Z124 Encounter for screening for malignant neoplasm of cervix: Secondary | ICD-10-CM

## 2012-02-28 MED ORDER — ALPRAZOLAM 0.25 MG PO TABS: 0.2500 mg | ORAL_TABLET | Freq: Three times a day (TID) | ORAL | Status: AC | PRN

## 2012-02-28 NOTE — Progress Notes (Signed)
Last Pap: 01/15/11 WNL: No Regular Periods:no, no period since Novasure procedure Contraception: BTL  Monthly Breast exam:yes Tetanus<58yrs:yes Nl.Bladder Function:yes Daily BMs:yes Healthy Diet:yes Calcium: yes Mammogram:no Date of Mammogram: n/a Exercise:yes Have often Exercise: daily walking  Seatbelt: yes Abuse at home: no Stressful work:no Sigmoid-colonoscopy: n/a Bone Density: No PCP: none Change in PMH: none Change in AOZ:HYQM Pt would like to know if she can be prescribed Xanax here since she does not currently have PCP.  C/o Stress urinary incontinence.  Poss mixed. Filed Vitals:   02/28/12 1535  BP: 118/70  Pulse: 70   ROS: noncontributory  Physical Examination: General appearance - alert, well appearing, and in no distress Neck - supple, no significant adenopathy Chest - clear to auscultation, no wheezes, rales or rhonchi, symmetric air entry Heart - normal rate and regular rhythm Abdomen - soft, nontender, nondistended, no masses or organomegaly Breasts - breasts appear normal, no suspicious masses, no skin or nipple changes or axillary nodes Pelvic - normal external genitalia, vulva, vagina, cervix, uterus and adnexa Back exam - no CVAT Extremities - no edema, redness or tenderness in the calves or thighs  A/P Sched bladder testing Pap today Xanax per pt request and refer to Dr. Tad Moore after bladder testing

## 2012-02-28 NOTE — Patient Instructions (Signed)
Pamphlets on TVT and urinary incontinence

## 2012-02-29 LAB — PAP IG W/ RFLX HPV ASCU

## 2012-03-06 ENCOUNTER — Telehealth: Payer: Self-pay | Admitting: Obstetrics and Gynecology

## 2012-03-06 NOTE — Telephone Encounter (Signed)
Deborah/rx req. °

## 2012-03-06 NOTE — Telephone Encounter (Signed)
Call to pt to schedule Lumax per AR scheduled 03/10/12 @ 2pm DF

## 2012-03-07 ENCOUNTER — Telehealth: Payer: Self-pay | Admitting: Obstetrics and Gynecology

## 2012-03-09 DIAGNOSIS — B9689 Other specified bacterial agents as the cause of diseases classified elsewhere: Secondary | ICD-10-CM | POA: Insufficient documentation

## 2012-03-20 ENCOUNTER — Ambulatory Visit (INDEPENDENT_AMBULATORY_CARE_PROVIDER_SITE_OTHER): Payer: Medicaid Other | Admitting: Obstetrics and Gynecology

## 2012-03-20 DIAGNOSIS — N393 Stress incontinence (female) (male): Secondary | ICD-10-CM

## 2012-03-21 ENCOUNTER — Encounter: Payer: Medicaid Other | Admitting: Obstetrics and Gynecology

## 2012-03-21 ENCOUNTER — Encounter: Payer: Self-pay | Admitting: Obstetrics and Gynecology

## 2012-03-21 NOTE — Progress Notes (Signed)
Lumax results reviewed and will review with pt at [facility].

## 2012-03-22 ENCOUNTER — Encounter: Payer: Medicaid Other | Admitting: Obstetrics and Gynecology

## 2012-04-05 ENCOUNTER — Encounter: Payer: Medicaid Other | Admitting: Obstetrics and Gynecology

## 2012-04-06 ENCOUNTER — Telehealth: Payer: Self-pay | Admitting: Obstetrics and Gynecology

## 2012-04-07 ENCOUNTER — Telehealth: Payer: Self-pay

## 2012-04-07 NOTE — Telephone Encounter (Signed)
Pt was called and given an ov for 04/10/2012 @ 4:15 with Dr. Su Hilt for F/U Lumax. Mathis Bud

## 2012-04-10 ENCOUNTER — Encounter: Payer: Self-pay | Admitting: Obstetrics and Gynecology

## 2012-04-10 ENCOUNTER — Ambulatory Visit (INDEPENDENT_AMBULATORY_CARE_PROVIDER_SITE_OTHER): Payer: Medicaid Other | Admitting: Obstetrics and Gynecology

## 2012-04-10 VITALS — BP 106/64 | Wt 170.0 lb

## 2012-04-10 DIAGNOSIS — R350 Frequency of micturition: Secondary | ICD-10-CM

## 2012-04-10 DIAGNOSIS — N3946 Mixed incontinence: Secondary | ICD-10-CM

## 2012-04-10 DIAGNOSIS — Z139 Encounter for screening, unspecified: Secondary | ICD-10-CM

## 2012-04-10 NOTE — Progress Notes (Signed)
Lumax results reviewed - SUI at about 350cc No compaints Clinically reports SUI and OAB  Filed Vitals:   04/10/12 1650  BP: 106/64   A/P Mixed incontinence - mostly SUI - desires surgical mgmt Declines meds for now Wants to proceed ASAP for financial reasons

## 2012-04-11 LAB — HEMOGLOBIN A1C
Hgb A1c MFr Bld: 5.3 % (ref <5.7)
Mean Plasma Glucose: 105 mg/dL (ref <117)

## 2012-04-11 LAB — TSH: TSH: 2.233 u[IU]/mL (ref 0.350–4.500)

## 2012-04-13 ENCOUNTER — Encounter: Payer: Medicaid Other | Admitting: Obstetrics and Gynecology

## 2012-04-13 LAB — URINE CULTURE: Colony Count: 35000

## 2012-04-15 ENCOUNTER — Encounter (HOSPITAL_COMMUNITY): Payer: Self-pay | Admitting: Pharmacist

## 2012-04-18 ENCOUNTER — Encounter (HOSPITAL_COMMUNITY): Payer: Self-pay | Admitting: *Deleted

## 2012-04-20 ENCOUNTER — Other Ambulatory Visit: Payer: Self-pay | Admitting: Obstetrics and Gynecology

## 2012-04-20 ENCOUNTER — Telehealth: Payer: Self-pay | Admitting: Obstetrics and Gynecology

## 2012-04-20 NOTE — Telephone Encounter (Signed)
TVT; Cystoscopy scheduled for 04/28/12 @ 11:15 with AR/EP.  Patient has MCD. -Adrianne Pridgen

## 2012-04-24 ENCOUNTER — Ambulatory Visit (INDEPENDENT_AMBULATORY_CARE_PROVIDER_SITE_OTHER): Payer: Medicaid Other | Admitting: Obstetrics and Gynecology

## 2012-04-24 ENCOUNTER — Encounter: Payer: Self-pay | Admitting: Obstetrics and Gynecology

## 2012-04-24 VITALS — BP 110/70 | HR 78 | Temp 98.3°F | Resp 16 | Ht 66.0 in | Wt 178.0 lb

## 2012-04-24 DIAGNOSIS — N3946 Mixed incontinence: Secondary | ICD-10-CM

## 2012-04-24 DIAGNOSIS — Z01818 Encounter for other preprocedural examination: Secondary | ICD-10-CM

## 2012-04-24 NOTE — Progress Notes (Signed)
Patricia Olson is a 31 y.o. female [phone] who presents for placement of tension free vaginal tape because of stress urinary predominant mixed urinary incontinence.  For over six years patient has had worsening symptoms of urine leakage with physical activity, coughing, sneezing or laughing.  She has also experience urinary frequency that is often disruptive accompanied by the occasional inability to make it to the bathroom in time.  Patient denies pelvic pain, vaginitis symptoms, dysuria, fever, flank pain or dyspareunia.  Patient underwent urodynamic testing 03/22/12 with a final diagnosis of stress urinary incontinence. Though given both medical and surgical management options, patient decided to proceed with surgical management in the form of placement of tension free vaginal tape and cystoscopy.   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   History of LEEP ? 2008,  Last PAP smear 2012  Medical History: ADHD, Bipolor Disorder, anxiety, depression thyromegaly, GERD, bone spur, migraines, & anemia.  Surgical History:  2010 Tubal Sterilization;  2012 Hysteroscopy, D & C with Novasure Ablation Denies problems with anesthesia or history of blood transfusions  Family History: Breast and bone cancers, asthma, diabetes, hypertension, anemia & migraines  Social History:  Married, stay-at-home Mom, Smokes 1 PPD-cigarettes, denies alcohol or illicit drugs   Outpatient Encounter Prescriptions as of 04/24/2012  Medication Sig Dispense Refill  . diclofenac (VOLTAREN) 75 MG EC tablet Take 75 mg by mouth 2 (two) times daily as needed. For pain from bone spur      . DULoxetine HCl (CYMBALTA PO) Take by mouth.        No Known Allergies Denies sensitivity to soy, latex, peanuts, shellfish or  adhesives  ROS: Denies corrective lenses, removable dental hardware , headache, vision changes, dysphagia, tinnitus, dizziness,  chest pain, shortness of breath, nausea, vomiting, diarrhea, dysuria, hematuria, pelvic pain, swelling of joints,easy bruising,  myalgias, arthralgias, skin rashes and except as is mentioned in the history of present illness, patient's review of systems is otherwise negative   Physical Exam    BP 110/70  Pulse 78  Temp 98.3 F (36.8 C) (Oral)  Resp 16  Ht 5\' 6"  (1.676 m)  Wt 178 lb (80.74 kg)  BMI 28.73 kg/m2  Neck: supple without masses; slight thyromegaly + on right Lungs: clear to auscultation Heart: regular rate and rhythm Abdomen: soft, non-tender and no organomegaly Pelvic: PATIENT REFUSED (Exam, July 2012-normal external genitalia, vulva, vagina, cervix, uterus and adnexa) Extremities:  no clubbing, cyanosis or edema   Assesment:  Stress Urinary Incontinence   Mixed Urinary Incontinence    Disposition:  A discussion was held with patient regarding the indication for her procedure(s) along with the risks, which include but are not limited to: reaction to anesthesia, damage to adjacent organs, infection,  excessive bleeding, worsening of symptoms, urinary retention and erosion of tension free vaginal tape. Patient verbalized understanding of these risks and has consented to proceed with Placement of Tension Free Vaginal Tape and Cystoscopy at [facility] [place], April 28, 2012 at 11:15 a.m.   CSN# [id number]   Delno Blaisdell J. Lowell Guitar, PA-C  for Dr. Woodroe Mode. Su Hilt

## 2012-04-26 NOTE — H&P (Signed)
Patricia Olson is a 31 y.o. female [id number] who presents for placement of tension free vaginal tape because of stress urinary predominant mixed urinary incontinence.  For over six years patient has had worsening symptoms of urine leakage with physical activity, coughing, sneezing or laughing.  She has also experience urinary frequency that is often disruptive accompanied by the occasional inability to make it to the bathroom in time.  Patient denies pelvic pain, vaginitis symptoms, dysuria, fever, flank pain or dyspareunia.  Patient underwent urodynamic testing 03/22/12 with a final diagnosis of stress urinary incontinence. Though given both medical and surgical management options, patient decided to proceed with surgical management in the form of placement of tension free vaginal tape and cystoscopy.   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   History of LEEP ? 2008,  Last PAP smear 2012  Medical History: ADHD, Bipolor Disorder, anxiety, depression thyromegaly, GERD, bone spur, migraines, & anemia.  Surgical History:  2010 Tubal Sterilization;  2012 Hysteroscopy, D & C with Novasure Ablation Denies problems with anesthesia or history of blood transfusions  Family History: Breast and bone cancers, asthma, diabetes, hypertension, anemia & migraines  Social History:  Married, stay-at-home Mom, Smokes 1 PPD-cigarettes, denies alcohol or illicit drugs   Outpatient Encounter Prescriptions as of 04/24/2012  Medication Sig Dispense Refill  . diclofenac (VOLTAREN) 75 MG EC tablet Take 75 mg by mouth 2 (two) times daily as needed. For pain from bone spur      . DULoxetine HCl (CYMBALTA PO) Take by mouth.        No Known Allergies Denies sensitivity to soy, latex, peanuts, shellfish or  adhesives  ROS: Denies corrective lenses, removable dental hardware , headache, vision changes, dysphagia, tinnitus, dizziness,  chest pain, shortness of breath, nausea, vomiting, diarrhea, dysuria, hematuria, pelvic pain, swelling of joints,easy bruising,  myalgias, arthralgias, skin rashes and except as is mentioned in the history of present illness, patient's review of systems is otherwise negative   Physical Exam    BP 110/70  Pulse 78  Temp 98.3 F (36.8 C) (Oral)  Resp 16  Ht 5' 6" (1.676 m)  Wt 178 lb (80.74 kg)  BMI 28.73 kg/m2  Neck: supple without masses; slight thyromegaly + on right Lungs: clear to auscultation Heart: regular rate and rhythm Abdomen: soft, non-tender and no organomegaly Pelvic: PATIENT REFUSED (Exam, July 2012-normal external genitalia, vulva, vagina, cervix, uterus and adnexa) Extremities:  no clubbing, cyanosis or edema   Assesment:  Stress Urinary Incontinence   Mixed Urinary Incontinence    Disposition:  A discussion was held with patient regarding the indication for her procedure(s) along with the risks, which include but are not limited to: reaction to anesthesia, damage to adjacent organs, infection,  excessive bleeding, worsening of symptoms, urinary retention and erosion of tension free vaginal tape. Patient verbalized understanding of these risks and has consented to proceed with Placement of Tension Free Vaginal Tape and Cystoscopy at [facility] [place], April 28, 2012 at 11:15 a.m.   CSN# [id number]   Hadyn Blanck J. Gregg Holster, PA-C  for Dr. Angela Y. Roberts     Patricia Olson is a 31 y.o. female [phone] who presents for placement of tension free vaginal tape because of stress urinary predominant mixed urinary incontinence.  For over six years patient has had worsening symptoms of urine leakage with physical activity, coughing, sneezing or laughing.  She has also experience urinary frequency that is often disruptive accompanied by the occasional inability to make it to the bathroom in time.  Patient denies pelvic pain, vaginitis symptoms, dysuria, fever, flank pain or dyspareunia.  Patient underwent urodynamic testing 03/22/12 with a final diagnosis of stress urinary incontinence. Though given both medical and surgical management options, patient decided to proceed with surgical management in the form of placement of tension free vaginal tape and cystoscopy.   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   History of LEEP ? 2008,  Last PAP smear 2012  Medical History: ADHD, Bipolor Disorder, anxiety, depression thyromegaly, GERD, bone spur, migraines, & anemia.  Surgical History:  2010 Tubal Sterilization;  2012 Hysteroscopy, D & C with Novasure Ablation Denies problems with anesthesia or history of blood transfusions  Family History: Breast and bone cancers, asthma, diabetes, hypertension, anemia & migraines  Social History:  Married, stay-at-home Mom, Smokes 1 PPD-cigarettes, denies alcohol or illicit drugs   Outpatient Encounter Prescriptions as of 04/24/2012 Medication Sig Dispense Refill . diclofenac (VOLTAREN) 75 MG EC tablet Take 75 mg by mouth 2 (two) times daily as needed. For pain from bone spur     . DULoxetine HCl (CYMBALTA PO) Take by mouth.       No Known Allergies/ Denies sensitivity to soy, latex, peanuts, shellfish or  adhesives  ROS: Denies corrective lenses, removable dental hardware , headache, vision changes, dysphagia, tinnitus, dizziness,  chest pain, shortness of breath, nausea, vomiting, diarrhea, dysuria, hematuria, pelvic pain, swelling of joints,easy bruising,  myalgias, arthralgias, skin rashes and except as is mentioned in the history of present illness, patient's review of systems is otherwise negative   Physical Exam    BP 110/70  Pulse 78  Temp 98.3 F (36.8 C) (Oral)  Resp 16  Ht 5\' 6"  (1.676 m)  Wt 178 lb (80.74 kg)  BMI 28.73 kg/m2  Neck: supple without masses; slight thyromegaly + on right Lungs: clear to auscultation Heart: regular rate and rhythm Abdomen: soft, non-tender and no organomegaly Pelvic: PATIENT REFUSED (Exam, July 2012-normal external genitalia, vulva, vagina, cervix, uterus and adnexa) Extremities:  no clubbing, cyanosis or edema   Assesment:  Stress Urinary Incontinence   Mixed Urinary Incontinence    Disposition:  A discussion was held with patient regarding the indication for her procedure(s) along with the risks, which include but are not limited to: reaction to anesthesia, damage to adjacent organs, infection,  excessive bleeding, worsening of symptoms, urinary retention and erosion of tension free vaginal tape. Patient verbalized understanding of these risks and has consented to proceed with Placement of Tension Free Vaginal Tape and Cystoscopy at [facility] [place], April 28, 2012 at 11:15 a.m.   CSN# [id number]   Breann Losano J. Lowell Guitar, PA-C  for Dr. Woodroe Mode. Su Hilt

## 2012-04-28 ENCOUNTER — Encounter (HOSPITAL_COMMUNITY): Payer: Self-pay | Admitting: Anesthesiology

## 2012-04-28 ENCOUNTER — Encounter (HOSPITAL_COMMUNITY): Payer: Self-pay | Admitting: *Deleted

## 2012-04-28 ENCOUNTER — Encounter (HOSPITAL_COMMUNITY)
Admission: RE | Disposition: A | Discharge status: Home / Self Care | Payer: Self-pay | Source: Ambulatory Visit | Attending: Obstetrics and Gynecology

## 2012-04-28 ENCOUNTER — Ambulatory Visit (HOSPITAL_COMMUNITY): Payer: Medicaid Other | Admitting: Anesthesiology

## 2012-04-28 ENCOUNTER — Ambulatory Visit (HOSPITAL_COMMUNITY)
Admission: RE | Admit: 2012-04-28 | Discharge: 2012-04-29 | Disposition: A | Discharge status: Home / Self Care | Payer: Medicaid Other | Source: Ambulatory Visit | Attending: Obstetrics and Gynecology | Admitting: Obstetrics and Gynecology

## 2012-04-28 DIAGNOSIS — N393 Stress incontinence (female) (male): Secondary | ICD-10-CM | POA: Insufficient documentation

## 2012-04-28 DIAGNOSIS — N3946 Mixed incontinence: Secondary | ICD-10-CM

## 2012-04-28 HISTORY — DX: Major depressive disorder, single episode, unspecified: F32.9

## 2012-04-28 HISTORY — DX: Depression, unspecified: F32.A

## 2012-04-28 HISTORY — PX: BLADDER SUSPENSION: SHX72

## 2012-04-28 LAB — CBC
MCH: 29.2 pg (ref 26.0–34.0)
MCHC: 33.5 g/dL (ref 30.0–36.0)
MCV: 87.2 fL (ref 78.0–100.0)
Platelets: 190 10*3/uL (ref 150–400)
RBC: 4.21 MIL/uL (ref 3.87–5.11)

## 2012-04-28 LAB — HCG, SERUM, QUALITATIVE: Preg, Serum: NEGATIVE

## 2012-04-28 SURGERY — URETHROPEXY, USING TRANSVAGINAL TAPE
Anesthesia: General | Site: Vagina | Wound class: Clean Contaminated

## 2012-04-28 MED ORDER — ESTRADIOL 0.1 MG/GM VA CREA
TOPICAL_CREAM | VAGINAL | Status: DC | PRN
  Administered 2012-04-28: 1 via VAGINAL

## 2012-04-28 MED ORDER — DEXAMETHASONE SODIUM PHOSPHATE 4 MG/ML IJ SOLN: INTRAMUSCULAR | Status: DC | PRN

## 2012-04-28 MED ORDER — ESTRADIOL 0.1 MG/GM VA CREA
TOPICAL_CREAM | VAGINAL | Status: AC
  Filled 2012-04-28: qty 42.5

## 2012-04-28 MED ORDER — ONDANSETRON HCL 4 MG/2ML IJ SOLN
INTRAMUSCULAR | Status: DC | PRN
  Administered 2012-04-28: 4 mg via INTRAVENOUS

## 2012-04-28 MED ORDER — MIDAZOLAM HCL 5 MG/5ML IJ SOLN
INTRAMUSCULAR | Status: DC | PRN
  Administered 2012-04-28: 2 mg via INTRAVENOUS

## 2012-04-28 MED ORDER — KETOROLAC TROMETHAMINE 30 MG/ML IJ SOLN: 30.0000 mg | Freq: Four times a day (QID) | INTRAMUSCULAR | Status: DC

## 2012-04-28 MED ORDER — LACTATED RINGERS IV SOLN
INTRAVENOUS | Status: DC
  Administered 2012-04-28: 125 mL/h via INTRAVENOUS
  Administered 2012-04-29: 04:00:00 via INTRAVENOUS

## 2012-04-28 MED ORDER — PROPOFOL 10 MG/ML IV EMUL
INTRAVENOUS | Status: AC
  Filled 2012-04-28: qty 20

## 2012-04-28 MED ORDER — ONDANSETRON HCL 4 MG/2ML IJ SOLN: 4.0000 mg | Freq: Four times a day (QID) | INTRAMUSCULAR | Status: DC | PRN

## 2012-04-28 MED ORDER — OXYCODONE-ACETAMINOPHEN 5-325 MG PO TABS
1.0000 | ORAL_TABLET | ORAL | Status: DC | PRN
  Administered 2012-04-28 – 2012-04-29 (×3): 2 via ORAL
  Filled 2012-04-28 (×3): qty 2

## 2012-04-28 MED ORDER — ONDANSETRON HCL 4 MG/2ML IJ SOLN
INTRAMUSCULAR | Status: AC
  Filled 2012-04-28: qty 2

## 2012-04-28 MED ORDER — HYDROMORPHONE HCL PF 1 MG/ML IJ SOLN
1.0000 mg | INTRAMUSCULAR | Status: DC | PRN
  Administered 2012-04-28 – 2012-04-29 (×2): 2 mg via INTRAVENOUS
  Filled 2012-04-28: qty 1
  Filled 2012-04-28: qty 2
  Filled 2012-04-28: qty 1
  Filled 2012-04-28: qty 2

## 2012-04-28 MED ORDER — DULOXETINE HCL 20 MG PO CPEP
20.0000 mg | ORAL_CAPSULE | Freq: Every day | ORAL | Status: DC
  Filled 2012-04-28: qty 1

## 2012-04-28 MED ORDER — FENTANYL CITRATE 0.05 MG/ML IJ SOLN
INTRAMUSCULAR | Status: AC
  Filled 2012-04-28: qty 5

## 2012-04-28 MED ORDER — ROCURONIUM BROMIDE 100 MG/10ML IV SOLN
INTRAVENOUS | Status: DC | PRN
  Administered 2012-04-28: 30 mg via INTRAVENOUS

## 2012-04-28 MED ORDER — KETOROLAC TROMETHAMINE 30 MG/ML IJ SOLN
30.0000 mg | Freq: Four times a day (QID) | INTRAMUSCULAR | Status: AC
  Administered 2012-04-28 – 2012-04-29 (×3): 30 mg via INTRAVENOUS
  Filled 2012-04-28 (×3): qty 1

## 2012-04-28 MED ORDER — LACTATED RINGERS IV SOLN
INTRAVENOUS | Status: DC | PRN
  Administered 2012-04-28 (×3): via INTRAVENOUS

## 2012-04-28 MED ORDER — GLYCOPYRROLATE 0.2 MG/ML IJ SOLN
INTRAMUSCULAR | Status: AC
  Filled 2012-04-28: qty 3

## 2012-04-28 MED ORDER — DEXAMETHASONE SODIUM PHOSPHATE 4 MG/ML IJ SOLN
INTRAMUSCULAR | Status: DC | PRN
  Administered 2012-04-28: 10 mg via INTRAVENOUS

## 2012-04-28 MED ORDER — LIDOCAINE HCL (CARDIAC) 20 MG/ML IV SOLN
INTRAVENOUS | Status: AC
  Filled 2012-04-28: qty 5

## 2012-04-28 MED ORDER — HYDROMORPHONE HCL PF 1 MG/ML IJ SOLN
INTRAMUSCULAR | Status: AC
  Administered 2012-04-28: 0.5 mg
  Filled 2012-04-28: qty 1

## 2012-04-28 MED ORDER — KETOROLAC TROMETHAMINE 30 MG/ML IJ SOLN
INTRAMUSCULAR | Status: DC | PRN
  Administered 2012-04-28: 30 mg via INTRAVENOUS

## 2012-04-28 MED ORDER — NICOTINE 21 MG/24HR TD PT24
21.0000 mg | MEDICATED_PATCH | Freq: Once | TRANSDERMAL | Status: DC
  Administered 2012-04-28: 21 mg via TRANSDERMAL
  Filled 2012-04-28: qty 1

## 2012-04-28 MED ORDER — SODIUM CHLORIDE 0.9 % IJ SOLN: 9.0000 mL | INTRAMUSCULAR | Status: DC | PRN

## 2012-04-28 MED ORDER — KETOROLAC TROMETHAMINE 30 MG/ML IJ SOLN: 15.0000 mg | Freq: Once | INTRAMUSCULAR | Status: DC | PRN

## 2012-04-28 MED ORDER — NALOXONE HCL 0.4 MG/ML IJ SOLN: 0.4000 mg | INTRAMUSCULAR | Status: DC | PRN

## 2012-04-28 MED ORDER — PROPOFOL 10 MG/ML IV EMUL
INTRAVENOUS | Status: DC | PRN
  Administered 2012-04-28: 200 mg via INTRAVENOUS

## 2012-04-28 MED ORDER — NEOSTIGMINE METHYLSULFATE 1 MG/ML IJ SOLN
INTRAMUSCULAR | Status: DC | PRN
  Administered 2012-04-28: 3 mg via INTRAVENOUS

## 2012-04-28 MED ORDER — HYDROMORPHONE 0.3 MG/ML IV SOLN
INTRAVENOUS | Status: DC
  Administered 2012-04-28: 16:00:00 via INTRAVENOUS
  Filled 2012-04-28: qty 25

## 2012-04-28 MED ORDER — STERILE WATER FOR IRRIGATION IR SOLN
Status: DC | PRN
  Administered 2012-04-28: 1000 mL

## 2012-04-28 MED ORDER — GLYCOPYRROLATE 0.2 MG/ML IJ SOLN
INTRAMUSCULAR | Status: DC | PRN
  Administered 2012-04-28: 0.6 mg via INTRAVENOUS

## 2012-04-28 MED ORDER — HYDROMORPHONE HCL PF 1 MG/ML IJ SOLN
0.2500 mg | INTRAMUSCULAR | Status: DC | PRN
  Administered 2012-04-28 (×2): 0.5 mg via INTRAVENOUS

## 2012-04-28 MED ORDER — FENTANYL CITRATE 0.05 MG/ML IJ SOLN
INTRAMUSCULAR | Status: DC | PRN
  Administered 2012-04-28: 100 ug via INTRAVENOUS
  Administered 2012-04-28: 50 ug via INTRAVENOUS

## 2012-04-28 MED ORDER — VASOPRESSIN 20 UNIT/ML IJ SOLN
INTRAMUSCULAR | Status: AC
  Filled 2012-04-28: qty 1

## 2012-04-28 MED ORDER — ROCURONIUM BROMIDE 50 MG/5ML IV SOLN
INTRAVENOUS | Status: AC
  Filled 2012-04-28: qty 1

## 2012-04-28 MED ORDER — DIPHENHYDRAMINE HCL 12.5 MG/5ML PO ELIX
12.5000 mg | ORAL_SOLUTION | Freq: Four times a day (QID) | ORAL | Status: DC | PRN
  Filled 2012-04-28: qty 5

## 2012-04-28 MED ORDER — DEXAMETHASONE SODIUM PHOSPHATE 10 MG/ML IJ SOLN
INTRAMUSCULAR | Status: AC
  Filled 2012-04-28: qty 1

## 2012-04-28 MED ORDER — DULOXETINE HCL 30 MG PO CPEP
30.0000 mg | ORAL_CAPSULE | Freq: Every day | ORAL | Status: DC
  Administered 2012-04-29: 30 mg via ORAL
  Filled 2012-04-28: qty 1

## 2012-04-28 MED ORDER — DIPHENHYDRAMINE HCL 50 MG/ML IJ SOLN: 12.5000 mg | Freq: Four times a day (QID) | INTRAMUSCULAR | Status: DC | PRN

## 2012-04-28 MED ORDER — MIDAZOLAM HCL 2 MG/2ML IJ SOLN
INTRAMUSCULAR | Status: AC
  Filled 2012-04-28: qty 2

## 2012-04-28 MED ORDER — IBUPROFEN 600 MG PO TABS: 600.0000 mg | ORAL_TABLET | Freq: Four times a day (QID) | ORAL | Status: DC | PRN

## 2012-04-28 MED ORDER — CEFAZOLIN SODIUM-DEXTROSE 2-3 GM-% IV SOLR
INTRAVENOUS | Status: DC | PRN
  Administered 2012-04-28: 2 g via INTRAVENOUS

## 2012-04-28 MED ORDER — INDIGOTINDISULFONATE SODIUM 8 MG/ML IJ SOLN
INTRAMUSCULAR | Status: DC | PRN
  Administered 2012-04-28: 5 mL via INTRAVENOUS

## 2012-04-28 MED ORDER — LIDOCAINE HCL (CARDIAC) 20 MG/ML IV SOLN
INTRAVENOUS | Status: DC | PRN
  Administered 2012-04-28: 90 mg via INTRAVENOUS

## 2012-04-28 MED ORDER — NEOSTIGMINE METHYLSULFATE 1 MG/ML IJ SOLN
INTRAMUSCULAR | Status: AC
  Filled 2012-04-28: qty 10

## 2012-04-28 MED ORDER — VASOPRESSIN 20 UNIT/ML IJ SOLN
INTRAVENOUS | Status: DC | PRN
  Administered 2012-04-28: 13:00:00 via INTRAMUSCULAR

## 2012-04-28 SURGICAL SUPPLY — 38 items
ADH SKN CLS APL DERMABOND .7 (GAUZE/BANDAGES/DRESSINGS) ×1
BLADE SURG 11 STRL SS (BLADE) ×2 IMPLANT
BLADE SURG 15 STRL LF C SS BP (BLADE) ×1 IMPLANT
BLADE SURG 15 STRL SS (BLADE) ×2
CANISTER SUCTION 2500CC (MISCELLANEOUS) ×2 IMPLANT
CATH FOLEY 2WAY SLVR  5CC 18FR (CATHETERS) ×3
CATH FOLEY 2WAY SLVR 5CC 18FR (CATHETERS) ×2 IMPLANT
CLOTH BEACON ORANGE TIMEOUT ST (SAFETY) ×2 IMPLANT
DECANTER SPIKE VIAL GLASS SM (MISCELLANEOUS) IMPLANT
DERMABOND ADVANCED (GAUZE/BANDAGES/DRESSINGS) ×1
DERMABOND ADVANCED .7 DNX12 (GAUZE/BANDAGES/DRESSINGS) ×1 IMPLANT
DRAPE HYSTEROSCOPY (DRAPE) ×2 IMPLANT
DRSG COVADERM PLUS 2X2 (GAUZE/BANDAGES/DRESSINGS) IMPLANT
GAUZE PACKING 1 X5 YD ST (GAUZE/BANDAGES/DRESSINGS) ×1 IMPLANT
GAUZE PACKING 2X5 YD STERILE (GAUZE/BANDAGES/DRESSINGS) IMPLANT
GAUZE SPONGE 4X4 16PLY XRAY LF (GAUZE/BANDAGES/DRESSINGS) IMPLANT
GLOVE BIO SURGEON STRL SZ7.5 (GLOVE) ×4 IMPLANT
GLOVE BIOGEL PI IND STRL 7.5 (GLOVE) ×1 IMPLANT
GLOVE BIOGEL PI INDICATOR 7.5 (GLOVE) ×1
GOWN PREVENTION PLUS LG XLONG (DISPOSABLE) ×6 IMPLANT
NDL SPNL 22GX3.5 QUINCKE BK (NEEDLE) IMPLANT
NEEDLE HYPO 22GX1.5 SAFETY (NEEDLE) ×2 IMPLANT
NEEDLE SPNL 22GX3.5 QUINCKE BK (NEEDLE) IMPLANT
NS IRRIG 1000ML POUR BTL (IV SOLUTION) ×2 IMPLANT
PACK VAGINAL WOMENS (CUSTOM PROCEDURE TRAY) ×2 IMPLANT
SET CYSTO W/LG BORE CLAMP LF (SET/KITS/TRAYS/PACK) ×2 IMPLANT
SLING TRANS VAGINAL TAPE (Sling) ×1 IMPLANT
SLING UTERINE/ABD GYNECARE TVT (Sling) ×1 IMPLANT
SUT MNCRL AB 3-0 PS2 27 (SUTURE) IMPLANT
SUT MNCRL AB 4-0 PS2 18 (SUTURE) IMPLANT
SUT VIC AB 0 CT1 27 (SUTURE) ×2
SUT VIC AB 0 CT1 27XBRD ANBCTR (SUTURE) ×1 IMPLANT
SUT VIC AB 2-0 SH 27 (SUTURE) ×6
SUT VIC AB 2-0 SH 27XBRD (SUTURE) ×8 IMPLANT
TOWEL OR 17X24 6PK STRL BLUE (TOWEL DISPOSABLE) ×4 IMPLANT
TRAY FOLEY CATH 14FR (SET/KITS/TRAYS/PACK) ×2 IMPLANT
TRAY FOLEY CATH 16FR SILVER (SET/KITS/TRAYS/PACK) ×1 IMPLANT
WATER STERILE IRR 1000ML POUR (IV SOLUTION) ×2 IMPLANT

## 2012-04-28 NOTE — Op Note (Signed)
Preop Diagnosis: Stress Urinary Incontinence   Postop Diagnosis: stress urinary incontinence   Procedure: TRANSVAGINAL TAPE (TVT) PROCEDURE CYSTOSCOPY   Anesthesia: General   Anesthesiologist: Dana Allan, MD   Attending: Purcell Nails, MD   Assistant: E. Lowell Guitar, PA-C  Findings: N/a  Pathology: N/a  Fluids: 2000cc   UOP: 100cc  EBL: 25cc  Complications: None  Procedure: The patient was taken to the operating room after the risks, benefits and alternatives were discussed with the patient, the patient verbalized understanding and consent signed and witnessed. The patient was placed under general anesthesia and prepped and draped in the normal sterile fashion in the dorsal lithotomy position. A weighted speculum was placed in the patient's vagina and the anterior vaginal wall was injected with dilute pitressin at a concentration of 20 units of pitressin in a total of 100cc of normal saline.  An incision was made in the anterior wall of the vagina for approximately 1cm beneath the midurethra and the underlying tissue was dissected away from the anterior vaginal wall down to the level of the lower symphysis pubis bilaterally. Attention was then turned to the mons pubis where two 5 mm incisions were made 2 fingerbreadths from the midline. The transabdominal guide was then passed through the mons pubis incision on the patient's right down through the space of Retzius and out through the anterior vaginal wall after deflecting the rigid urethral catheter guide to the ipsilateral side. The same was done on the contralateral side. Cystoscopy was performed and no invadvertant bladder injury was noted. The bladder was drained with a Foley while deflecting the rigid urethral catheter guide to the patient's right and the mesh was attached to the transabdominal guide and elevated up through the space of Retzius and out through the incision on the mons pubis on the ipsilateral side. The same was  done on the contralateral side. Cystoscopy was performed again and no inadvertant bladder injury was noted. The 43 French Foley was left in the urethra and a large Tresa Endo was placed between the urethra and the mesh in order to leave the mesh slack beneath the midurethra. The mesh was then cut flush with the skin at the mons pubis incisions bilaterally. Indigo carmine had been administered, cystoscopy was performed again and bilateral ureters were noted to efflux without difficulty. The bilateral incisions on the mons pubis were then cleaned and Dermabond applied. The anterior vaginal wall incision was repaired with 2-0 vicryl with interrupted and running stitches.  Vagina was packed with estrogen soaked vaginal packing.  Sponge, lap and needle count was correct.  The patient tolerated the procedure well and was returned to the recovery room in good condition.

## 2012-04-28 NOTE — Interval H&P Note (Signed)
History and Physical Interval Note:  04/28/2012 11:43 AM  Patricia Olson  has presented today for surgery, with the diagnosis of Stress Urinary Incontinence  The various methods of treatment have been discussed with the patient and family. After consideration of risks, benefits and other options for treatment, the patient has consented to  Procedure(s) (LRB): TRANSVAGINAL TAPE (TVT) PROCEDURE (N/A) as a surgical intervention .  The patient's history has been reviewed, patient examined, no change in status, stable for surgery.  I have reviewed the patient's chart and labs.  Questions were answered to the patient's satisfaction.     Purcell Nails

## 2012-04-28 NOTE — H&P (View-Only) (Signed)
  Patricia Olson is a 31 y.o. female [phone] who presents for placement of tension free vaginal tape because of stress urinary predominant mixed urinary incontinence.  For over six years patient has had worsening symptoms of urine leakage with physical activity, coughing, sneezing or laughing.  She has also experience urinary frequency that is often disruptive accompanied by the occasional inability to make it to the bathroom in time.  Patient denies pelvic pain, vaginitis symptoms, dysuria, fever, flank pain or dyspareunia.  Patient underwent urodynamic testing 03/22/12 with a final diagnosis of stress urinary incontinence. Though given both medical and surgical management options, patient decided to proceed with surgical management in the form of placement of tension free vaginal tape and cystoscopy.   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   History of LEEP ? 2008,  Last PAP smear 2012  Medical History: ADHD, Bipolor Disorder, anxiety, depression thyromegaly, GERD, bone spur, migraines, & anemia.  Surgical History:  2010 Tubal Sterilization;  2012 Hysteroscopy, D & C with Novasure Ablation Denies problems with anesthesia or history of blood transfusions  Family History: Breast and bone cancers, asthma, diabetes, hypertension, anemia & migraines  Social History:  Married, stay-at-home Mom, Smokes 1 PPD-cigarettes, denies alcohol or illicit drugs   Outpatient Encounter Prescriptions as of 04/24/2012 Medication Sig Dispense Refill . diclofenac (VOLTAREN) 75 MG EC tablet Take 75 mg by mouth 2 (two) times daily as needed. For pain from bone spur     . DULoxetine HCl (CYMBALTA PO) Take by mouth.       No Known Allergies/ Denies sensitivity to soy, latex, peanuts, shellfish or  adhesives  ROS: Denies corrective lenses, removable dental hardware , headache, vision changes, dysphagia, tinnitus, dizziness,  chest pain, shortness of breath, nausea, vomiting, diarrhea, dysuria, hematuria, pelvic pain, swelling of joints,easy bruising,  myalgias, arthralgias, skin rashes and except as is mentioned in the history of present illness, patient's review of systems is otherwise negative   Physical Exam    BP 110/70  Pulse 78  Temp 98.3 F (36.8 C) (Oral)  Resp 16  Ht 5\' 6"  (1.676 m)  Wt 178 lb (80.74 kg)  BMI 28.73 kg/m2  Neck: supple without masses; slight thyromegaly + on right Lungs: clear to auscultation Heart: regular rate and rhythm Abdomen: soft, non-tender and no organomegaly Pelvic: PATIENT REFUSED (Exam, July 2012-normal external genitalia, vulva, vagina, cervix, uterus and adnexa) Extremities:  no clubbing, cyanosis or edema   Assesment:  Stress Urinary Incontinence   Mixed Urinary Incontinence    Disposition:  A discussion was held with patient regarding the indication for her procedure(s) along with the risks, which include but are not limited to: reaction to anesthesia, damage to adjacent organs, infection,  excessive bleeding, worsening of symptoms, urinary retention and erosion of tension free vaginal tape. Patient verbalized understanding of these risks and has consented to proceed with Placement of Tension Free Vaginal Tape and Cystoscopy at [facility] [place], April 28, 2012 at 11:15 a.m.   CSN# [id number]   Jacci Ruberg J. Lowell Guitar, PA-C  for Dr. Woodroe Mode. Su Hilt

## 2012-04-28 NOTE — Progress Notes (Signed)
Day of Surgery Procedure(s) (LRB): TRANSVAGINAL TAPE (TVT) PROCEDURE (N/A)  Subjective: Patient reports no problems.  Just groggy from medicine.    Objective: I have reviewed patient's vital signs and intake and output.  General: alert Resp: clear to auscultation bilaterally Cardio: regular rate and rhythm GI: soft, NT, incisions c/d/dermabond intact, ND, +BS Extremities: Homans sign is negative, no sign of DVT Vaginal Bleeding: minimal Vaginal packing in place  Assessment: s/p Procedure(s) (LRB): TRANSVAGINAL TAPE (TVT) PROCEDURE (N/A): stable  Plan: Advance diet Continue foley due to urinary output monitoring SCDs for DVT prophylaxis Routine post op care UOP adequate Requesting nicotine patch (21mg  patch ordered) Will have packing removed at 6am along with foley. If pt voids without difficulty, tolerates po and ambulates without difficulty, will d/c home and f/u in office in 6wks. D/c instructions reviewed F/u in 6wks for postop appt  LOS: 0 days    Patricia Olson 04/28/2012, 4:39 PM

## 2012-04-28 NOTE — Anesthesia Preprocedure Evaluation (Addendum)
Anesthesia Evaluation  Patient identified by MRN, date of birth, ID band Patient awake    Reviewed: Allergy & Precautions, H&P , NPO status , Patient's Chart, lab work & pertinent test results, reviewed documented beta blocker date and time   History of Anesthesia Complications Negative for: history of anesthetic complications  Airway Mallampati: I TM Distance: <3 FB Neck ROM: full    Dental  (+) Teeth Intact   Pulmonary Current Smoker,  breath sounds clear to auscultation  Pulmonary exam normal       Cardiovascular negative cardio ROS  Rhythm:regular Rate:Normal     Neuro/Psych PSYCHIATRIC DISORDERS (depression) Anxiety Depression negative neurological ROS     GI/Hepatic Neg liver ROS, GERD- (occasional meds)  ,  Endo/Other  negative endocrine ROS  Renal/GU negative Renal ROS Bladder dysfunction      Musculoskeletal   Abdominal   Peds  Hematology negative hematology ROS (+)   Anesthesia Other Findings   Reproductive/Obstetrics negative OB ROS                          Anesthesia Physical Anesthesia Plan  ASA: II  Anesthesia Plan: General ETT   Post-op Pain Management:    Induction:   Airway Management Planned:   Additional Equipment:   Intra-op Plan:   Post-operative Plan:   Informed Consent: I have reviewed the patients History and Physical, chart, labs and discussed the procedure including the risks, benefits and alternatives for the proposed anesthesia with the patient or authorized representative who has indicated his/her understanding and acceptance.   Dental Advisory Given  Plan Discussed with: CRNA and Surgeon  Anesthesia Plan Comments:         Anesthesia Quick Evaluation

## 2012-04-28 NOTE — Transfer of Care (Signed)
Immediate Anesthesia Transfer of Care Note  Patient: Patricia Olson  Procedure(s) Performed: Procedure(s) (LRB): TRANSVAGINAL TAPE (TVT) PROCEDURE (N/A)  Patient Location: PACU  Anesthesia Type: General  Level of Consciousness: awake, alert  and oriented  Airway & Oxygen Therapy: Patient Spontanous Breathing and Patient connected to nasal cannula oxygen  Post-op Assessment: Report given to PACU RN and Post -op Vital signs reviewed and stable  Post vital signs: Reviewed and stable  Complications: No apparent anesthesia complications

## 2012-04-29 LAB — CBC
Hemoglobin: 11.4 g/dL — ABNORMAL LOW (ref 12.0–15.0)
MCH: 28.9 pg (ref 26.0–34.0)
MCHC: 32.9 g/dL (ref 30.0–36.0)
RDW: 12.2 % (ref 11.5–15.5)

## 2012-04-29 MED ORDER — HYDROMORPHONE HCL 2 MG PO TABS
ORAL_TABLET | ORAL | Status: AC
  Administered 2012-04-29: 2 mg via ORAL
  Filled 2012-04-29: qty 1

## 2012-04-29 MED ORDER — CIPROFLOXACIN HCL 250 MG PO TABS: 250.0000 mg | ORAL_TABLET | Freq: Two times a day (BID) | ORAL | Status: AC

## 2012-04-29 MED ORDER — HYDROCODONE-ACETAMINOPHEN 5-500 MG PO TABS: 1.0000 | ORAL_TABLET | Freq: Four times a day (QID) | ORAL | Status: AC | PRN

## 2012-04-29 MED ORDER — HYDROMORPHONE HCL 2 MG PO TABS
2.0000 mg | ORAL_TABLET | ORAL | Status: DC | PRN
  Administered 2012-04-29: 2 mg via ORAL

## 2012-04-29 MED ORDER — IBUPROFEN 600 MG PO TABS: 600.0000 mg | ORAL_TABLET | Freq: Four times a day (QID) | ORAL | Status: AC | PRN

## 2012-04-29 MED ORDER — PANTOPRAZOLE SODIUM 40 MG PO TBEC
40.0000 mg | DELAYED_RELEASE_TABLET | Freq: Every day | ORAL | Status: DC
  Administered 2012-04-29: 40 mg via ORAL
  Filled 2012-04-29: qty 1

## 2012-04-29 NOTE — Progress Notes (Signed)
Pt requested foley catheter to be removed, Dr. Su Hilt notified of pt request. Foley catheter and packing removed per Dr. Su Hilt instructions.

## 2012-04-29 NOTE — Progress Notes (Signed)
1 Day Post-Op Procedure(s): TRANSVAGINAL TAPE (TVT) PROCEDURE  Subjective: Patient reports incisional pain, tolerating PO, + flatus and no problems voiding.    Objective: BP 98/61  Pulse 62  Temp 98 F (36.7 C) (Oral)  Resp 14  Ht 5\' 6"  (1.676 m)  Wt 178 lb (80.74 kg)  BMI 28.73 kg/m2  SpO2 98%  General: alert and cooperative Resp: clear to auscultation bilaterally Cardio: regular rate and rhythm GI: soft, non-tender; bowel sounds normal; no masses,  no organomegaly Extremities: extremities normal, atraumatic, no cyanosis or edema Vaginal Bleeding: none  Assessment: s/p Procedure(s): TRANSVAGINAL TAPE (TVT) PROCEDURE: stable, progressing well, tolerating diet and voided spontaneously.  Discharge to home with vicodin, cipro and motrin.  Call to make an appointment with Dr Su Hilt  Plan: Encourage ambulation Discharge home  LOS: 1 day    Eller Sweis A 04/29/2012, 11:43 AM

## 2012-05-01 ENCOUNTER — Encounter (HOSPITAL_COMMUNITY): Payer: Self-pay | Admitting: Obstetrics and Gynecology

## 2012-05-01 ENCOUNTER — Telehealth: Payer: Self-pay | Admitting: Obstetrics and Gynecology

## 2012-05-01 NOTE — Telephone Encounter (Signed)
discussed steri gauze and tape report amount of bleeding in next hour verbalized understanding.Lavera Guise, CNM

## 2012-05-04 ENCOUNTER — Ambulatory Visit (INDEPENDENT_AMBULATORY_CARE_PROVIDER_SITE_OTHER): Payer: Medicaid Other | Admitting: Obstetrics and Gynecology

## 2012-05-04 ENCOUNTER — Telehealth: Payer: Self-pay | Admitting: Obstetrics and Gynecology

## 2012-05-04 ENCOUNTER — Encounter: Payer: Self-pay | Admitting: Obstetrics and Gynecology

## 2012-05-04 VITALS — BP 102/72 | Temp 98.5°F | Ht 66.0 in | Wt 178.0 lb

## 2012-05-04 DIAGNOSIS — Z09 Encounter for follow-up examination after completed treatment for conditions other than malignant neoplasm: Secondary | ICD-10-CM

## 2012-05-04 MED ORDER — CYCLOBENZAPRINE HCL 10 MG PO TABS: 10.0000 mg | ORAL_TABLET | Freq: Every evening | ORAL | Status: DC | PRN

## 2012-05-04 NOTE — Telephone Encounter (Signed)
Tc to pt regarding msg.  Pt states has 2 incisions that are lightly bleeding and continuing with vaginal pain and also has hip pain.  Pt also says that she is pulling out pieces out of her vagina that looks like "gauze".  Pt denies fever and odor.  Also continues w/ vaginal bldg with clots.  Per AR, pt can come in to office anytime today for eval.  Pt worked into sched today @ 1000 w/ AR.

## 2012-05-04 NOTE — Telephone Encounter (Signed)
Triage/rx req. °

## 2012-05-04 NOTE — Progress Notes (Signed)
C/o pain at sides Will likely be out of meds by Mon occas oozing at incisions Pulled something in vagina Feels like insides are falling out  Filed Vitals:   05/04/12 1047  BP: 102/72  Temp: 98.5 F (36.9 C)   Spec Incision healing well  Sutures in place  A/P Stop pulling at sutures Keep post op appt Flexeril qhs

## 2012-05-06 NOTE — Anesthesia Postprocedure Evaluation (Signed)
Anesthesia Post Note  Patient: Patricia Olson  Procedure(s) Performed: Procedure(s) (LRB): TRANSVAGINAL TAPE (TVT) PROCEDURE (N/A)  Anesthesia type: General  Patient location: PACU  Post pain: Pain level controlled  Post assessment: Post-op Vital signs reviewed  Post vital signs: Reviewed  Level of consciousness: sedated  Complications: No apparent anesthesia complications

## 2012-05-08 ENCOUNTER — Other Ambulatory Visit: Payer: Self-pay | Admitting: Obstetrics and Gynecology

## 2012-05-08 NOTE — Telephone Encounter (Signed)
Refill for pt on Vicodin 5-500 mg 1 po q 6 hours prn for pain #30 0 RF's called to [facility]. Per AR, pt is to use sparingly and always use the Motrin first. This refill needs to last pt til POP appt on Oct 9th. Pt understands. Melody Comas A

## 2012-05-08 NOTE — Telephone Encounter (Signed)
Ar pt 

## 2012-05-08 NOTE — Telephone Encounter (Signed)
JACKIE/RX REQ. °

## 2012-05-24 NOTE — Discharge Summary (Addendum)
Physician Discharge Summary  Patient ID: Patricia Olson MRN: [id number] DOB/AGE: [birth year] 32 y.o.  Admit date: 04/28/2012 Discharge date: 05/24/2012  Admission Diagnoses:  Discharge Diagnoses:  Active Problems:  * No active hospital problems. *    Discharged Condition: good  Hospital Course: Uneventful post op course s/p TVT.  Pt went home POD#1 voiding normally, ambulating and tolerating po.  Consults: None  Significant Diagnostic Studies: n/a  Treatments: IV hydration and surgery: TVT  Discharge Exam: Blood pressure 98/61, pulse 62, temperature 98 F (36.7 C), temperature source Oral, resp. rate 14, height 5\' 6"  (1.676 m), weight 178 lb (80.74 kg), SpO2 98.00%. see Dr. Redmond Baseman progress note (exam overall wnl)  Disposition: 01-Home or Self Care  Discharge Orders    Future Appointments: Provider: Department: Dept Phone: Center:   06/07/2012 9:30 AM Purcell Nails, MD [facility] [phone] None     Future Orders Please Complete By Expires   Diet general      Discharge instructions      Comments:   Call for vaginal bleeding if you soak greater than pad per hour   Increase activity slowly      May shower / Bathe      Sexual Activity Restrictions      Comments:   Pelvic rest.  Nothing in the vagina   Call MD for:  temperature >100.4      Call MD for:  severe uncontrolled pain      Call MD for:  persistant dizziness or light-headedness          Medication List     As of 05/24/2012 12:21 PM    TAKE these medications         CYMBALTA PO   Take by mouth.      diclofenac 75 MG EC tablet   Commonly known as: VOLTAREN   Take 75 mg by mouth 2 (two) times daily as needed. For pain from bone spur           Follow-up Information    Schedule an appointment as soon as possible for a visit with Purcell Nails, MD.   Contact information:   [address] [place] [address] [phone]          Signed: Purcell Nails 05/24/2012, 12:21 PM

## 2012-06-07 ENCOUNTER — Encounter: Payer: Medicaid Other | Admitting: Obstetrics and Gynecology

## 2012-06-12 ENCOUNTER — Encounter: Payer: Medicaid Other | Admitting: Obstetrics and Gynecology

## 2012-06-19 ENCOUNTER — Encounter: Payer: Self-pay | Admitting: Obstetrics and Gynecology

## 2012-06-19 ENCOUNTER — Ambulatory Visit (INDEPENDENT_AMBULATORY_CARE_PROVIDER_SITE_OTHER): Payer: Medicaid Other | Admitting: Obstetrics and Gynecology

## 2012-06-19 VITALS — BP 110/74 | Temp 99.0°F | Ht 66.0 in | Wt 176.0 lb

## 2012-06-19 DIAGNOSIS — N393 Stress incontinence (female) (male): Secondary | ICD-10-CM

## 2012-06-19 NOTE — Progress Notes (Signed)
Patient ID: Patricia Olson, female   DOB: August 06, 1981, 31 y.o.   MRN: [id number] DATE OF SURGERY: 04/28/2012 TYPE OF SURGERY:TVT PAIN:No VAG BLEEDING: yes VAG DISCHARGE: yes, no sx's; white/creamy NORMAL GI FUNCTN: yes NORMAL GU FUNCTN: yes  No comlaints except wishes she had no menses at all s/p in office ablation ago.  She is s/p TVT about 6wks ago.  She resumed IC 2wks ago.  Currently menses is light and last about 2-3days  Filed Vitals:   06/19/12 1607  BP: 110/74  Temp: 99 F (37.2 C)   ROS: noncontributory  Well healed pubic incisions  Pelvic exam:  VULVA: normal appearing vulva with no masses, tenderness or lesions,  VAGINA: normal appearing vagina with normal color and discharge, no lesions,  Well healed vaginal incision CERVIX: normal appearing cervix without discharge or lesions,  UTERUS: uterus is normal size, shape, consistency and nontender,  ADNEXA: normal adnexa in size, nontender and no masses.  A/P Doing well s/p TVT and cystoscopy AEX in July

## 2012-07-30 DIAGNOSIS — K115 Sialolithiasis: Secondary | ICD-10-CM

## 2012-07-30 HISTORY — DX: Sialolithiasis: K11.5

## 2012-08-04 ENCOUNTER — Other Ambulatory Visit: Payer: Self-pay | Admitting: Obstetrics and Gynecology

## 2012-08-06 ENCOUNTER — Emergency Department (HOSPITAL_COMMUNITY)
Admission: EM | Admit: 2012-08-06 | Discharge: 2012-08-06 | Disposition: A | Discharge status: Home / Self Care | Payer: Medicaid Other | Attending: Emergency Medicine | Admitting: Emergency Medicine

## 2012-08-06 ENCOUNTER — Emergency Department (HOSPITAL_COMMUNITY): Payer: Medicaid Other

## 2012-08-06 ENCOUNTER — Encounter (HOSPITAL_COMMUNITY): Payer: Self-pay | Admitting: Emergency Medicine

## 2012-08-06 DIAGNOSIS — F3289 Other specified depressive episodes: Secondary | ICD-10-CM | POA: Insufficient documentation

## 2012-08-06 DIAGNOSIS — R450 Nervousness: Secondary | ICD-10-CM | POA: Insufficient documentation

## 2012-08-06 DIAGNOSIS — M79606 Pain in leg, unspecified: Secondary | ICD-10-CM

## 2012-08-06 DIAGNOSIS — Z862 Personal history of diseases of the blood and blood-forming organs and certain disorders involving the immune mechanism: Secondary | ICD-10-CM | POA: Insufficient documentation

## 2012-08-06 DIAGNOSIS — F17200 Nicotine dependence, unspecified, uncomplicated: Secondary | ICD-10-CM | POA: Insufficient documentation

## 2012-08-06 DIAGNOSIS — F411 Generalized anxiety disorder: Secondary | ICD-10-CM | POA: Insufficient documentation

## 2012-08-06 DIAGNOSIS — R269 Unspecified abnormalities of gait and mobility: Secondary | ICD-10-CM | POA: Insufficient documentation

## 2012-08-06 DIAGNOSIS — Z8619 Personal history of other infectious and parasitic diseases: Secondary | ICD-10-CM | POA: Insufficient documentation

## 2012-08-06 DIAGNOSIS — M79609 Pain in unspecified limb: Secondary | ICD-10-CM | POA: Insufficient documentation

## 2012-08-06 DIAGNOSIS — M898X9 Other specified disorders of bone, unspecified site: Secondary | ICD-10-CM | POA: Insufficient documentation

## 2012-08-06 DIAGNOSIS — Z8639 Personal history of other endocrine, nutritional and metabolic disease: Secondary | ICD-10-CM | POA: Insufficient documentation

## 2012-08-06 DIAGNOSIS — Z79899 Other long term (current) drug therapy: Secondary | ICD-10-CM | POA: Insufficient documentation

## 2012-08-06 DIAGNOSIS — K219 Gastro-esophageal reflux disease without esophagitis: Secondary | ICD-10-CM | POA: Insufficient documentation

## 2012-08-06 DIAGNOSIS — M2550 Pain in unspecified joint: Secondary | ICD-10-CM | POA: Insufficient documentation

## 2012-08-06 DIAGNOSIS — F329 Major depressive disorder, single episode, unspecified: Secondary | ICD-10-CM | POA: Insufficient documentation

## 2012-08-06 DIAGNOSIS — Z8742 Personal history of other diseases of the female genital tract: Secondary | ICD-10-CM | POA: Insufficient documentation

## 2012-08-06 MED ORDER — KETOROLAC TROMETHAMINE 60 MG/2ML IM SOLN
60.0000 mg | Freq: Once | INTRAMUSCULAR | Status: AC
  Administered 2012-08-06: 60 mg via INTRAMUSCULAR
  Filled 2012-08-06: qty 2

## 2012-08-06 MED ORDER — OXYCODONE-ACETAMINOPHEN 5-325 MG PO TABS: 1.0000 | ORAL_TABLET | ORAL | Status: DC | PRN

## 2012-08-06 NOTE — ED Provider Notes (Signed)
History     CSN: [id number]  Arrival date & time 08/06/12  1237   None     Chief Complaint  Patient presents with  . Hip Pain    (Consider location/radiation/quality/duration/timing/severity/associated sxs/prior treatment) Patient is a 31 y.o. female presenting with leg pain. The history is provided by the patient. No language interpreter was used.  Leg Pain  The incident occurred more than 1 week ago. The incident occurred at home. There was no injury mechanism. The pain is present in the left thigh. The quality of the pain is described as burning and throbbing. The pain is at a severity of 8/10. The pain is moderate. The pain has been intermittent since onset. Pertinent negatives include no numbness, no inability to bear weight, no loss of motion, no loss of sensation and no tingling. She reports no foreign bodies present. The symptoms are aggravated by activity. She has tried NSAIDs for the symptoms. The treatment provided mild relief.  31 yo c/o anterior mid shaft  femur pain that has been intermitant x 1 year.(denies L hip or back pain).  The pain is not reproducible but worse with ambulation.    States she has been taking mobic for the last 3 days with no relief.  States that Dr Clarene Duke at [facility] put her on this medication for bursitis.   She has tried heat for the pain as well.  No long trips or calf pain.  PERC -. Took nothing for pain today.    Past Medical History  Diagnosis Date  . HSV infection   . Hx of candidiasis   . Hx of varicella   . Abnormal Pap smear 2008    colpo  . History of chlamydia 1998  . History of gonorrhea   . HSV infection   . BV (bacterial vaginosis) 05/20/2005  . Leukorrhea 2007  . ASCUS with positive high risk HPV 06/22/06  . 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 Thyromegaly 2010  . H/O menorrhagia 01/15/11  . GERD (gastroesophageal reflux disease)     otc, omeprazole  . Libido, decreased 2011  . Depression    cymbalta  . Bone spur     Past Surgical History  Procedure Date  . Wisdom tooth extraction   . Cervix surgery 2000  . Tubal ligation   . Dilation and curettage of uterus 2013    ablation in office  . Bladder suspension 04/28/2012    Procedure: TRANSVAGINAL TAPE (TVT) PROCEDURE;  Surgeon: Purcell Nails, MD;  Location: [facility];  Service: Gynecology;  Laterality: N/A;  cysto    Family History  Problem Relation Age of Onset  . Cancer Maternal Grandmother     Breast  . Depression Maternal Grandmother     History  Substance Use Topics  . Smoking status: Current Every Day Smoker -- 1.0 packs/day for 17 years    Types: Cigarettes  . Smokeless tobacco: Never Used  . Alcohol Use: No    OB History    Grav Para Term Preterm Abortions TAB SAB Ect Mult Living   4 4 4       4       Review of Systems  Constitutional: Negative.   HENT: Negative.   Eyes: Negative.   Respiratory: Negative.   Cardiovascular: Negative.  Negative for leg swelling.  Gastrointestinal: Negative.   Musculoskeletal: Positive for arthralgias and gait problem. Negative for back pain and joint swelling.  Neurological: Negative.  Negative for dizziness, tingling, weakness and  numbness.  Hematological: Negative for adenopathy.  Psychiatric/Behavioral: The patient is nervous/anxious.   All other systems reviewed and are negative.    Allergies  Review of patient's allergies indicates no known allergies.  Home Medications   Current Outpatient Rx  Name  Route  Sig  Dispense  Refill  . DICLOFENAC SODIUM 75 MG PO TBEC   Oral   Take 75 mg by mouth 2 (two) times daily as needed. For pain from bone spur         . MELOXICAM 15 MG PO TABS   Oral   Take 15 mg by mouth daily.           BP 126/88  Pulse 92  Temp 97.8 F (36.6 C) (Oral)  Resp 18  SpO2 100%  LMP 08/05/2012  Physical Exam  Nursing note and vitals reviewed. Constitutional: She is oriented to person, place, and time. She appears  well-developed and well-nourished.  HENT:  Head: Normocephalic and atraumatic.  Eyes: Conjunctivae normal and EOM are normal. Pupils are equal, round, and reactive to light.  Neck: Normal range of motion. Neck supple.  Cardiovascular: Normal rate.   Pulmonary/Chest: Effort normal.  Abdominal: Soft.  Musculoskeletal: Normal range of motion. She exhibits no edema and no tenderness.       No tenderness to the femur.  Cool to touch  + CMS below the pain.  Neurological: She is alert and oriented to person, place, and time. She has normal reflexes.  Skin: Skin is warm and dry.  Psychiatric: She has a normal mood and affect.    ED Course  Procedures (including critical care time)  Labs Reviewed - No data to display Dg Femur Left  08/06/2012  *RADIOLOGY REPORT*  Clinical Data: Hip pain  LEFT FEMUR - 2 VIEW  Comparison: None.  Findings: Four views of the left femur submitted.  No acute fracture or subluxation.  No periosteal reaction or bony erosion.  IMPRESSION: No acute fracture or subluxation.   Original Report Authenticated By: Natasha Mead, M.D.      No diagnosis found.    MDM  Femur pain with ambulation x 1 year intermittant.  PERC -.  X-ray show no abnormalities reviewed by myself.   No fever, infection or deformity.  Will follow up with ortho as needed.  Toradol IM in the ER with some relief.  Continue mobic and use percocet for severe pain. She is ready for discharge.  Refusing crutches.        Remi Haggard, NP 08/07/12 1249

## 2012-08-06 NOTE — ED Notes (Signed)
Patient was dx with arthritis about a year ago and now is having the worst pain that she has ever had to her left hip to knee

## 2012-08-07 NOTE — ED Provider Notes (Signed)
Medical screening examination/treatment/procedure(s) were performed by non-physician practitioner and as supervising physician I was immediately available for consultation/collaboration.    Bartlomiej Jenkinson L Asar Evilsizer, MD 08/07/12 2245 

## 2012-08-10 ENCOUNTER — Emergency Department (HOSPITAL_COMMUNITY)
Admission: EM | Admit: 2012-08-10 | Discharge: 2012-08-10 | Disposition: A | Discharge status: Home / Self Care | Payer: Medicaid Other | Attending: Emergency Medicine | Admitting: Emergency Medicine

## 2012-08-10 ENCOUNTER — Encounter (HOSPITAL_COMMUNITY): Payer: Self-pay | Admitting: Emergency Medicine

## 2012-08-10 DIAGNOSIS — K115 Sialolithiasis: Secondary | ICD-10-CM | POA: Insufficient documentation

## 2012-08-10 DIAGNOSIS — K140 Glossitis: Secondary | ICD-10-CM | POA: Insufficient documentation

## 2012-08-10 DIAGNOSIS — K1379 Other lesions of oral mucosa: Secondary | ICD-10-CM

## 2012-08-10 DIAGNOSIS — K120 Recurrent oral aphthae: Secondary | ICD-10-CM | POA: Insufficient documentation

## 2012-08-10 DIAGNOSIS — F17200 Nicotine dependence, unspecified, uncomplicated: Secondary | ICD-10-CM | POA: Insufficient documentation

## 2012-08-10 DIAGNOSIS — K1370 Unspecified lesions of oral mucosa: Secondary | ICD-10-CM | POA: Insufficient documentation

## 2012-08-10 MED ORDER — AMOXICILLIN 500 MG PO CAPS: 500.0000 mg | ORAL_CAPSULE | Freq: Three times a day (TID) | ORAL | Status: DC

## 2012-08-10 NOTE — ED Notes (Signed)
Pt reports swelling under tongue. Refered to ENT on Tuesday via [place] [facility] fot treatment of "stones". Pt stated that she can not afford to go to the specialist

## 2012-08-10 NOTE — H&P (Signed)
Assessment   Sialoadenitis (527.2) (K11.20).  Sialolithiasis (527.5) (K11.5). Orders  Clindamycin HCl - 300 MG Oral Capsule;Take one 3 times daily; Qty30; R0; Rx. Discussed  Right submandibular duct stone with obstructing symptoms. Recommend start her on antibiotics. We will schedule her for next week for intraoral sialolithotomy. She does not think she can do this under local because she has panic attacks. She also has a bad tooth on that side. Hopefully the clindamycin will help that as well. Strongly urged her to stop smoking and stopped drinking [facility]. We discussed the dangers of smoking and the problems of chronic dehydration from too much caffeine causing some of her stones. Reason For Visit  Patricia Olson is here today at the kind request of none for consultation and opinion for salivary stone. HPI  History of floor of mouth and tongue swelling and pain and intermittent swelling of the neck mainly on the right side. She had a CT at [facility] and a small distal ductal stone was identified in the right submandibular duct. She hasn't been treated. She also has a bad tooth on that side. She smokes a pack per day and drinks 2-3 bottles of [facility] each day. Allergies  No Known Drug Allergies. Current Meds  Percocet TABS (Oxycodone-Acetaminophen);; RPT. PSH  Bladder Surgery Oral Surgery Tooth Extraction Tubal Ligation. Family Hx  Bone cancer: Grandmother (C41.9) Family history of blood clots: Aunt (V18.3) (Z83.2) Family history of malignant neoplasm of breast: Grandmother (V16.3) (Z80.3) No pertinent family history: Mother. Personal Hx  Caffeine use; 3 cups daily Non-smoker (V49.89) (Z78.9) Smoker (305.1) (F17.200). ROS  Systemic: Not feeling tired (fatigue).  No fever, no night sweats, and no recent weight loss. Head: Headache. Eyes: No eye symptoms. Otolaryngeal: No hearing loss, no earache, no tinnitus, and no purulent nasal discharge.  No nasal passage blockage  (stuffiness), no snoring, no sneezing, no hoarseness, and no sore throat. Cardiovascular: No chest pain or discomfort  and no palpitations. Pulmonary: No dyspnea, no cough, and no wheezing. Gastrointestinal: Dysphagia.  No heartburn.  No nausea, no abdominal pain, and no melena.  No diarrhea. Genitourinary: No dysuria. Endocrine: No muscle weakness. Musculoskeletal: No calf muscle cramps  and no arthralgias.  Soft tissue swelling. Neurological: No dizziness, no fainting, no tingling, and no numbness. Psychological: No anxiety  and no depression. Skin: No rash. 12 system ROS was obtained and reviewed on the Health Maintenance form dated today.  Positive responses are shown above.  If the symptom is not checked, the patient has denied it. Vital Signs   Recorded by [facility] on 10 Aug 2012 02:45 PM BP:120/82,  Height: 65 in, Weight: 165 lb, BMI: 27.5 kg/m2,  BMI Calculated: 27.46 ,  BSA Calculated: 1.82. Physical Exam  APPEARANCE: Well developed, well nourished, in no acute distress.  Normal affect, in a pleasant mood.  Oriented to time, place and person. COMMUNICATION: Normal voice   HEAD & FACE:  No scars, lesions or masses of head and face.  Sinuses nontender to palpation.  Salivary glands without mass or tenderness.  Facial strength symmetric.  No facial lesion, scars, or mass. EYES: EOMI with normal primary gaze alignment. Visual acuity grossly intact.  PERRLA EXTERNAL EAR & NOSE: No scars, lesions or masses  EAC & TYMPANIC MEMBRANE:  EAC shows no obstructing lesions or debris and tympanic membranes are normal bilaterally with good movement to insufflation. GROSS HEARING: Normal   TMJ:  Nontender  INTRANASAL EXAM: No polyps or purulence.  NASOPHARYNX: Normal, without  lesions. LIPS, TEETH & GUMS: No lip lesions, right inferior canine is tender to percussion, and normal gums. ORAL CAVITY/OROPHARYNX: Oral cavity reveals mild edema of the floor of mouth, without exudate or  erythema. She's tender around the right submandibular duct. I am unable to express any saliva from either side. NECK:  Supple without adenopathy or mass. THYROID:  Normal with no masses palpable.  NEUROLOGIC:  No gross CN deficits. No nystagmus noted.   LYMPHATIC:  No enlarged nodes palpable. Signature  Electronically signed by : Serena Colonel  M.D.; 08/10/2012 4:07 PM EST.

## 2012-08-11 ENCOUNTER — Encounter (HOSPITAL_BASED_OUTPATIENT_CLINIC_OR_DEPARTMENT_OTHER): Payer: Self-pay | Admitting: *Deleted

## 2012-08-14 ENCOUNTER — Encounter (HOSPITAL_BASED_OUTPATIENT_CLINIC_OR_DEPARTMENT_OTHER): Admission: RE | Payer: Self-pay | Source: Ambulatory Visit

## 2012-08-14 ENCOUNTER — Ambulatory Visit (HOSPITAL_BASED_OUTPATIENT_CLINIC_OR_DEPARTMENT_OTHER): Admission: RE | Admit: 2012-08-14 | Payer: Self-pay | Source: Ambulatory Visit | Admitting: Otolaryngology

## 2012-08-14 HISTORY — DX: Sialolithiasis: K11.5

## 2012-08-14 SURGERY — REMOVAL, CALCULUS, SALIVARY DUCT
Anesthesia: General | Laterality: Right

## 2012-08-14 NOTE — ED Provider Notes (Signed)
History     CSN: [id number]  Arrival date & time 08/10/12  1125   First MD Initiated Contact with Patient 08/10/12 1308      Chief Complaint  Patient presents with  . Oral Swelling    swelling in tongue x 4 days    (Consider location/radiation/quality/duration/timing/severity/associated sxs/prior treatment) Patient is a 31 y.o. female presenting with tooth pain. The history is provided by the patient.  Dental PainPrimary symptoms do not include fever. Primary symptoms comment: She complains of a painful swelling under her tongue for several days.  Additional symptoms do not include: trouble swallowing. Associated symptoms comments: She was seen in [place] in the emergency department and referred to the ENT for salivary gland stones but presents today for second opinion. No fever. No history of similar symptoms. She was given Norco for pain but no improvement..    Past Medical History  Diagnosis Date  . Submandibular sialolithiasis 07/2012    right    Past Surgical History  Procedure Date  . Wisdom tooth extraction   . Bladder suspension 04/28/2012    Procedure: TRANSVAGINAL TAPE (TVT) PROCEDURE;  Surgeon: Purcell Nails, MD;  Location: [facility];  Service: Gynecology;  Laterality: N/A;  cysto  . Tubal ligation 02/11/2009    lap. bilateral fulguration  . Laser ablation of the cervix 04/21/2001    Family History  Problem Relation Age of Onset  . Cancer Maternal Grandmother     Breast  . Depression Maternal Grandmother     History  Substance Use Topics  . Smoking status: Current Every Day Smoker -- 1.0 packs/day for 17 years    Types: Cigarettes  . Smokeless tobacco: Never Used  . Alcohol Use: No    OB History    Grav Para Term Preterm Abortions TAB SAB Ect Mult Living   4 4 4       4       Review of Systems  Constitutional: Negative for fever.  HENT: Positive for mouth sores. Negative for trouble swallowing and dental problem.   Gastrointestinal: Negative for  nausea.    Allergies  Review of patient's allergies indicates no known allergies.  Home Medications   Current Outpatient Rx  Name  Route  Sig  Dispense  Refill  . DICLOFENAC SODIUM 75 MG PO TBEC   Oral   Take 75 mg by mouth 2 (two) times daily as needed. For pain from bone spur.         . MELOXICAM 15 MG PO TABS   Oral   Take 15 mg by mouth daily as needed. For pain.         Marland Kitchen CLINDAMYCIN HCL 150 MG PO CAPS   Oral   Take 300 mg by mouth 3 (three) times daily.         Marland Kitchen HYDROCODONE-ACETAMINOPHEN 5-325 MG PO TABS   Oral   Take 1 tablet by mouth every 6 (six) hours as needed.           BP 111/75  Pulse 60  Temp 97.8 F (36.6 C) (Oral)  Resp 18  SpO2 100%  LMP 07/28/2012  Physical Exam  Constitutional: She appears well-developed and well-nourished. No distress.  HENT:  Right Ear: External ear normal.  Left Ear: External ear normal.       Very mild swelling to inferior tongue without abscess, drainage, lesion/ulceration, or discoloration. No submental adenopathy.   Neck: Normal range of motion. Neck supple. No thyromegaly present.    ED  Course  Procedures (including critical care time)  Labs Reviewed - No data to display No results found.   1. Mouth pain   2. Glossitis       MDM  Discussed ENT follow up as appropriate and encouraged her to make an appointment.        Rodena Medin, PA-C 08/22/12 1519

## 2012-08-23 NOTE — ED Provider Notes (Signed)
Medical screening examination/treatment/procedure(s) were performed by non-physician practitioner and as supervising physician I was immediately available for consultation/collaboration.   Dione Booze, MD 08/23/12 [phone]

## 2013-01-23 ENCOUNTER — Emergency Department (HOSPITAL_COMMUNITY): Admission: EM | Admit: 2013-01-23 | Discharge: 2013-01-23 | Discharge status: Against Medical Advice | Payer: Self-pay

## 2013-01-23 NOTE — ED Notes (Signed)
Pt decided not to stay and be seen per registration clerk. Pt did not answer when her named was called for triage for the second time.

## 2013-01-23 NOTE — ED Notes (Signed)
Pt not in WR when called for triage.

## 2013-07-18 ENCOUNTER — Other Ambulatory Visit: Payer: Self-pay | Admitting: Family Medicine

## 2013-07-18 DIAGNOSIS — M545 Low back pain, unspecified: Secondary | ICD-10-CM

## 2013-07-24 ENCOUNTER — Ambulatory Visit
Admission: RE | Admit: 2013-07-24 | Discharge: 2013-07-24 | Disposition: A | Discharge status: Home / Self Care | Payer: Medicaid Other | Source: Ambulatory Visit | Attending: Family Medicine | Admitting: Family Medicine

## 2013-07-24 ENCOUNTER — Other Ambulatory Visit: Payer: Medicaid Other

## 2013-07-24 DIAGNOSIS — M545 Low back pain, unspecified: Secondary | ICD-10-CM

## 2013-10-04 ENCOUNTER — Other Ambulatory Visit: Payer: Self-pay | Admitting: Obstetrics and Gynecology

## 2013-10-08 ENCOUNTER — Encounter (HOSPITAL_COMMUNITY): Payer: Self-pay | Admitting: Pharmacist

## 2013-10-11 ENCOUNTER — Other Ambulatory Visit (HOSPITAL_COMMUNITY): Payer: Self-pay | Admitting: Obstetrics and Gynecology

## 2013-10-11 NOTE — H&P (Signed)
Marlyne BeardsCori F Carrasco is a 33 y.o. female  P 4-0-0-4 presents for hysterectomy because of menorrhagia. For the past 5 years the patient reports heavy menstrual bleeding with clots and subsequently underwent an endometrial ablation in 2012.  Six months following that procedure, however, she began to resume heavy bleeding.  Menstrual flow lasts for 3 days with clots and pad change 4 times a day, .accompanied by  clots.  She experiences cramping rated as 3-6/10 on a 10 point pain scale but relieved with Midol.  She also reports bleeding after intercourse and at times with bowel movements. She denies urinary tract symptoms, intermenstrual bleeding, vaginitis symptoms  or dyspareunia. A pelvic ultrasound/sono-hysteorgram  in June 2012 was normal with no endometrial masses. (uterus: 7.65 x 4.66 x 3.30 cm with normal ovaries).  A review of both medical and surgical management options were given to the patient however, given the failure of her ablation procedure and the disruptive nature of her symptoms she has chosen definitive therapy in the form of hysterectomy.     Past Medical History  OB History: G4:  P 4-0-0-4,  all vaginal deliveries; (first two deliveries placed for adoption)  GYN History: menarche: 33 YO    LMP : 09/13/2013;   Contracepton bilateral tubal ligation  The patient reports a past history of: chlamydia, gonorrhea, herpes and HPV.  Has a history of an  abnormal PAP smear treated with LEEP procedure;   Last PAP smear: July 2014-normal  Medical History: Goiter/Thyroid Disease, Right Parotid Gland Stone, Stress Urinary Incontinence, ADHD, Bipolar Disorder, GERD, Right Foot Heel Spur, and Anemia,  Surgical History: 2002 Loop Electrosurgical Excision Procedure;  2010 Tubal Sterilization  2012 Novasure Endometrial Ablation and 2013 Bladder Suspension Denies problems with anesthesia or history of blood transfusions  Family History: Metastatic Breast Cancer, Bone Cancer, Depression, Asthma, Diabetes  Mellitus, Hypertension, Anemia, Migraines and Lung Cancer  Social History: 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   ROS:  Denies headache, vision changes, nasal congestion, dysphagia, tinnitus, dizziness, hoarseness, cough,  chest pain, shortness of breath, nausea, vomiting, diarrhea,constipation,  urinary frequency, urgency  dysuria, hematuria, vaginitis symptoms, pelvic pain, swelling of joints,easy bruising,  myalgias, arthralgias, skin rashes, unexplained weight loss and except as is mentioned in the history of present illness, patient's review of systems is otherwise negative.   Physical Exam  Bp: 98/60;  P: 80   R: 14   Temperature: 98.7 degrees F orally   Weight: 166 lbs.  Height:  5'6"     BMI: 26.8   Neck: supple without masses or thyromegaly Lungs: clear to auscultation Heart: regular rate and rhythm Abdomen: soft, non-tender and no organomegaly Pelvic:EGBUS- wnl; vagina-normal rugae; uterus-normal size, cervix without lesions or motion tenderness; adnexae-no tenderness or masses Extremities:  no clubbing, cyanosis or edema  Endometrial Biopsy was unable to be completed due to post ablation scarring of the endometrium    Assesment: Menorrhagia            S/P Endometrial Ablation   Disposition:  A discussion was held with patient regarding the indication for her procedure(s) along with the risks, which include but are not limited to: reaction to anesthesia, damage to adjacent organs, infection and excessive bleeding. the patient verbalized understanding of these risks and has consented  to proceed with a Total Laparosocpic Hysterectomy with Bilateral Salpingectomy, Cystoscopy, Possible Laparoscopically Assisted Hysterectomy and Possible Total Abdominal  Hysterectomy at [facility] on October 23, 2013 at 1 p.m.   CSN# [id number]   Chaia Ikard J. Lowell Guitar, PA-C  for Dr. Woodroe Mode. Su Hilt

## 2013-10-18 ENCOUNTER — Inpatient Hospital Stay (HOSPITAL_COMMUNITY): Admission: RE | Admit: 2013-10-18 | Payer: Medicaid Other | Source: Ambulatory Visit

## 2013-10-19 ENCOUNTER — Encounter (HOSPITAL_COMMUNITY)
Admission: RE | Admit: 2013-10-19 | Discharge: 2013-10-19 | Disposition: A | Discharge status: Home / Self Care | Payer: Medicaid Other | Source: Ambulatory Visit | Attending: Obstetrics and Gynecology | Admitting: Obstetrics and Gynecology

## 2013-10-19 ENCOUNTER — Encounter (HOSPITAL_COMMUNITY): Payer: Self-pay

## 2013-10-19 DIAGNOSIS — Z01812 Encounter for preprocedural laboratory examination: Secondary | ICD-10-CM | POA: Insufficient documentation

## 2013-10-19 HISTORY — DX: Dorsalgia, unspecified: M54.9

## 2013-10-19 HISTORY — DX: Obsessive-compulsive disorder, unspecified: F42.9

## 2013-10-19 HISTORY — DX: Other chronic pain: G89.29

## 2013-10-19 HISTORY — DX: Anemia, unspecified: D64.9

## 2013-10-19 LAB — CBC
HCT: 38.3 % (ref 36.0–46.0)
Hemoglobin: 13.1 g/dL (ref 12.0–15.0)
MCH: 28.9 pg (ref 26.0–34.0)
MCHC: 34.2 g/dL (ref 30.0–36.0)
MCV: 84.5 fL (ref 78.0–100.0)
PLATELETS: 172 10*3/uL (ref 150–400)
RBC: 4.53 MIL/uL (ref 3.87–5.11)
RDW: 12.2 % (ref 11.5–15.5)
WBC: 6.9 10*3/uL (ref 4.0–10.5)

## 2013-10-19 NOTE — Patient Instructions (Addendum)
   Your procedure is scheduled on: Tuesday, Feb 24  [facility] at: 1130 AM Pick up the phone at the desk and dial [phone] and inform us of your arrival.  Please call this number if you have any problems the morning of surgery: [phone]  Remember: Do not eat food after midnight: Monday Do not drink clear liquids after: 9 AM Tuesday, day of surgery Take these medicines the morning of surgery with a SIP OF WATER:  Do not wear jewelry, make-up, or FINGER nail polish No metal in your hair or on your body. Do not wear lotions, powders, perfumes.  You may wear deodorant. .  Do not bring valuables to the hospital. Contacts, dentures or bridgework may not be worn into surgery.  Leave suitcase in the car. After Surgery it may be brought to your room. For patients being admitted to the hospital, checkout time is 11:00am the day of discharge.   Home with - to be arranged prior to surgery ---friend Hellen Cats or Nelida GoresAngel Harrington

## 2013-10-23 ENCOUNTER — Encounter (HOSPITAL_COMMUNITY): Payer: Self-pay

## 2013-10-23 ENCOUNTER — Ambulatory Visit (HOSPITAL_COMMUNITY)
Admission: RE | Admit: 2013-10-23 | Discharge: 2013-10-24 | Disposition: A | Discharge status: Home / Self Care | Payer: Medicaid Other | Source: Ambulatory Visit | Attending: Obstetrics and Gynecology | Admitting: Obstetrics and Gynecology

## 2013-10-23 ENCOUNTER — Encounter (HOSPITAL_COMMUNITY): Payer: Medicaid Other | Admitting: Certified Registered Nurse Anesthetist

## 2013-10-23 ENCOUNTER — Inpatient Hospital Stay (HOSPITAL_COMMUNITY): Payer: Medicaid Other | Admitting: Certified Registered Nurse Anesthetist

## 2013-10-23 ENCOUNTER — Encounter (HOSPITAL_COMMUNITY)
Admission: RE | Disposition: A | Discharge status: Home / Self Care | Payer: Self-pay | Source: Ambulatory Visit | Attending: Obstetrics and Gynecology

## 2013-10-23 DIAGNOSIS — Z90710 Acquired absence of both cervix and uterus: Secondary | ICD-10-CM | POA: Diagnosis present

## 2013-10-23 DIAGNOSIS — N920 Excessive and frequent menstruation with regular cycle: Principal | ICD-10-CM | POA: Insufficient documentation

## 2013-10-23 DIAGNOSIS — F17200 Nicotine dependence, unspecified, uncomplicated: Secondary | ICD-10-CM | POA: Insufficient documentation

## 2013-10-23 DIAGNOSIS — IMO0002 Reserved for concepts with insufficient information to code with codable children: Secondary | ICD-10-CM | POA: Insufficient documentation

## 2013-10-23 HISTORY — PX: LAPAROSCOPIC HYSTERECTOMY: SHX1926

## 2013-10-23 HISTORY — PX: CYSTOSCOPY: SHX5120

## 2013-10-23 LAB — PREGNANCY, URINE: Preg Test, Ur: NEGATIVE

## 2013-10-23 SURGERY — HYSTERECTOMY, TOTAL, LAPAROSCOPIC
Anesthesia: General | Site: Abdomen

## 2013-10-23 MED ORDER — NEOSTIGMINE METHYLSULFATE 1 MG/ML IJ SOLN
INTRAMUSCULAR | Status: AC
  Filled 2013-10-23: qty 1

## 2013-10-23 MED ORDER — DIPHENHYDRAMINE HCL 50 MG/ML IJ SOLN: 12.5000 mg | Freq: Four times a day (QID) | INTRAMUSCULAR | Status: DC | PRN

## 2013-10-23 MED ORDER — SODIUM CHLORIDE 0.9 % IJ SOLN: 9.0000 mL | INTRAMUSCULAR | Status: DC | PRN

## 2013-10-23 MED ORDER — FENTANYL CITRATE 0.05 MG/ML IJ SOLN
INTRAMUSCULAR | Status: AC
  Administered 2013-10-23: 25 ug via INTRAVENOUS
  Filled 2013-10-23: qty 2

## 2013-10-23 MED ORDER — HYDROMORPHONE HCL PF 1 MG/ML IJ SOLN
INTRAMUSCULAR | Status: AC
  Filled 2013-10-23: qty 1

## 2013-10-23 MED ORDER — FENTANYL CITRATE 0.05 MG/ML IJ SOLN
INTRAMUSCULAR | Status: AC
  Filled 2013-10-23: qty 5

## 2013-10-23 MED ORDER — FENTANYL CITRATE 0.05 MG/ML IJ SOLN
INTRAMUSCULAR | Status: AC
  Filled 2013-10-23: qty 2

## 2013-10-23 MED ORDER — GLYCOPYRROLATE 0.2 MG/ML IJ SOLN
INTRAMUSCULAR | Status: DC | PRN
  Administered 2013-10-23 (×2): 0.1 mg via INTRAVENOUS
  Administered 2013-10-23: 0.4 mg via INTRAVENOUS

## 2013-10-23 MED ORDER — METHYLENE BLUE 1 % INJ SOLN
INTRAMUSCULAR | Status: AC
  Filled 2013-10-23: qty 10

## 2013-10-23 MED ORDER — IBUPROFEN 600 MG PO TABS
600.0000 mg | ORAL_TABLET | Freq: Four times a day (QID) | ORAL | Status: DC | PRN
  Administered 2013-10-23: 600 mg via ORAL
  Filled 2013-10-23: qty 1

## 2013-10-23 MED ORDER — OXYCODONE-ACETAMINOPHEN 5-325 MG PO TABS
1.0000 | ORAL_TABLET | ORAL | Status: DC | PRN
  Administered 2013-10-23 – 2013-10-24 (×2): 2 via ORAL
  Filled 2013-10-23 (×2): qty 2

## 2013-10-23 MED ORDER — PROPOFOL 10 MG/ML IV BOLUS
INTRAVENOUS | Status: DC | PRN
  Administered 2013-10-23: 200 mg via INTRAVENOUS

## 2013-10-23 MED ORDER — ONDANSETRON HCL 4 MG/2ML IJ SOLN
INTRAMUSCULAR | Status: DC | PRN
  Administered 2013-10-23 (×2): 2 mg via INTRAVENOUS

## 2013-10-23 MED ORDER — HYDROMORPHONE HCL PF 1 MG/ML IJ SOLN
INTRAMUSCULAR | Status: DC | PRN
  Administered 2013-10-23: 1 mg via INTRAVENOUS

## 2013-10-23 MED ORDER — LIDOCAINE HCL (CARDIAC) 20 MG/ML IV SOLN
INTRAVENOUS | Status: AC
  Filled 2013-10-23: qty 5

## 2013-10-23 MED ORDER — STERILE WATER FOR IRRIGATION IR SOLN
Status: DC | PRN
  Administered 2013-10-23: 1000 mL

## 2013-10-23 MED ORDER — KETOROLAC TROMETHAMINE 30 MG/ML IJ SOLN
INTRAMUSCULAR | Status: AC
  Filled 2013-10-23: qty 1

## 2013-10-23 MED ORDER — MIDAZOLAM HCL 2 MG/2ML IJ SOLN
INTRAMUSCULAR | Status: DC | PRN
  Administered 2013-10-23: 2 mg via INTRAVENOUS

## 2013-10-23 MED ORDER — NEOSTIGMINE METHYLSULFATE 1 MG/ML IJ SOLN
INTRAMUSCULAR | Status: DC | PRN
  Administered 2013-10-23: 3 mg via INTRAVENOUS

## 2013-10-23 MED ORDER — ROCURONIUM BROMIDE 100 MG/10ML IV SOLN
INTRAVENOUS | Status: DC | PRN
  Administered 2013-10-23: 40 mg via INTRAVENOUS
  Administered 2013-10-23: 10 mg via INTRAVENOUS

## 2013-10-23 MED ORDER — MORPHINE SULFATE 10 MG/ML IJ SOLN
5.0000 mg | Freq: Four times a day (QID) | INTRAMUSCULAR | Status: DC | PRN
  Administered 2013-10-23: 5 mg via INTRAVENOUS
  Filled 2013-10-23 (×2): qty 1

## 2013-10-23 MED ORDER — ONDANSETRON HCL 4 MG/2ML IJ SOLN: 4.0000 mg | Freq: Four times a day (QID) | INTRAMUSCULAR | Status: DC | PRN

## 2013-10-23 MED ORDER — NALOXONE HCL 0.4 MG/ML IJ SOLN: 0.4000 mg | INTRAMUSCULAR | Status: DC | PRN

## 2013-10-23 MED ORDER — DEXAMETHASONE SODIUM PHOSPHATE 10 MG/ML IJ SOLN
INTRAMUSCULAR | Status: AC
  Filled 2013-10-23: qty 1

## 2013-10-23 MED ORDER — GLYCOPYRROLATE 0.2 MG/ML IJ SOLN
INTRAMUSCULAR | Status: AC
  Filled 2013-10-23: qty 2

## 2013-10-23 MED ORDER — KETOROLAC TROMETHAMINE 30 MG/ML IJ SOLN
INTRAMUSCULAR | Status: DC | PRN
  Administered 2013-10-23: 30 mg via INTRAVENOUS

## 2013-10-23 MED ORDER — BUPIVACAINE HCL (PF) 0.25 % IJ SOLN
INTRAMUSCULAR | Status: AC
  Filled 2013-10-23: qty 10

## 2013-10-23 MED ORDER — CEFAZOLIN SODIUM-DEXTROSE 2-3 GM-% IV SOLR
INTRAVENOUS | Status: AC
  Filled 2013-10-23: qty 50

## 2013-10-23 MED ORDER — MORPHINE SULFATE 10 MG/ML IJ SOLN: 5.0000 mg | Freq: Once | INTRAMUSCULAR | Status: DC

## 2013-10-23 MED ORDER — METHYLENE BLUE 1 % INJ SOLN
INTRAMUSCULAR | Status: DC | PRN
  Administered 2013-10-23: 8 mL via INTRAVENOUS

## 2013-10-23 MED ORDER — GLYCOPYRROLATE 0.2 MG/ML IJ SOLN
INTRAMUSCULAR | Status: AC
  Filled 2013-10-23: qty 3

## 2013-10-23 MED ORDER — DEXAMETHASONE SODIUM PHOSPHATE 10 MG/ML IJ SOLN
INTRAMUSCULAR | Status: DC | PRN
  Administered 2013-10-23: 10 mg via INTRAVENOUS

## 2013-10-23 MED ORDER — ONDANSETRON HCL 4 MG/2ML IJ SOLN
INTRAMUSCULAR | Status: AC
  Filled 2013-10-23: qty 2

## 2013-10-23 MED ORDER — LACTATED RINGERS IV SOLN: INTRAVENOUS | Status: DC

## 2013-10-23 MED ORDER — MORPHINE SULFATE ER 15 MG PO TBCR
15.0000 mg | EXTENDED_RELEASE_TABLET | Freq: Three times a day (TID) | ORAL | Status: DC | PRN
  Administered 2013-10-23: 15 mg via ORAL
  Filled 2013-10-23: qty 1

## 2013-10-23 MED ORDER — HYDROMORPHONE 0.3 MG/ML IV SOLN
INTRAVENOUS | Status: DC
  Filled 2013-10-23: qty 25

## 2013-10-23 MED ORDER — CEFAZOLIN SODIUM-DEXTROSE 2-3 GM-% IV SOLR
2.0000 g | INTRAVENOUS | Status: AC
  Administered 2013-10-23: 2 g via INTRAVENOUS

## 2013-10-23 MED ORDER — FENTANYL CITRATE 0.05 MG/ML IJ SOLN
25.0000 ug | INTRAMUSCULAR | Status: DC | PRN
  Administered 2013-10-23: 25 ug via INTRAVENOUS

## 2013-10-23 MED ORDER — KETOROLAC TROMETHAMINE 30 MG/ML IJ SOLN: 30.0000 mg | Freq: Four times a day (QID) | INTRAMUSCULAR | Status: DC

## 2013-10-23 MED ORDER — FENTANYL CITRATE 0.05 MG/ML IJ SOLN
INTRAMUSCULAR | Status: DC | PRN
  Administered 2013-10-23 (×2): 100 ug via INTRAVENOUS
  Administered 2013-10-23: 50 ug via INTRAVENOUS
  Administered 2013-10-23: 100 ug via INTRAVENOUS

## 2013-10-23 MED ORDER — NICOTINE 21 MG/24HR TD PT24
21.0000 mg | MEDICATED_PATCH | Freq: Every day | TRANSDERMAL | Status: DC
  Administered 2013-10-23 – 2013-10-24 (×2): 21 mg via TRANSDERMAL
  Filled 2013-10-23 (×3): qty 1

## 2013-10-23 MED ORDER — PROPOFOL 10 MG/ML IV EMUL
INTRAVENOUS | Status: AC
  Filled 2013-10-23: qty 20

## 2013-10-23 MED ORDER — LIDOCAINE HCL (CARDIAC) 20 MG/ML IV SOLN
INTRAVENOUS | Status: DC | PRN
  Administered 2013-10-23: 80 mg via INTRAVENOUS

## 2013-10-23 MED ORDER — BUPIVACAINE HCL (PF) 0.25 % IJ SOLN
INTRAMUSCULAR | Status: AC
  Filled 2013-10-23: qty 20

## 2013-10-23 MED ORDER — QUETIAPINE FUMARATE 25 MG PO TABS
50.0000 mg | ORAL_TABLET | Freq: Every day | ORAL | Status: DC
  Filled 2013-10-23: qty 2
  Filled 2013-10-23: qty 1

## 2013-10-23 MED ORDER — BUPIVACAINE HCL (PF) 0.25 % IJ SOLN
INTRAMUSCULAR | Status: DC | PRN
  Administered 2013-10-23: 20 mL

## 2013-10-23 MED ORDER — DIPHENHYDRAMINE HCL 12.5 MG/5ML PO ELIX: 12.5000 mg | ORAL_SOLUTION | Freq: Four times a day (QID) | ORAL | Status: DC | PRN

## 2013-10-23 MED ORDER — LACTATED RINGERS IV SOLN
INTRAVENOUS | Status: DC
  Administered 2013-10-23 (×3): via INTRAVENOUS

## 2013-10-23 MED ORDER — ROCURONIUM BROMIDE 100 MG/10ML IV SOLN
INTRAVENOUS | Status: AC
  Filled 2013-10-23: qty 1

## 2013-10-23 MED ORDER — FENTANYL CITRATE 0.05 MG/ML IJ SOLN
INTRAMUSCULAR | Status: AC
  Administered 2013-10-23: 50 ug
  Filled 2013-10-23: qty 2

## 2013-10-23 MED ORDER — ZOLPIDEM TARTRATE 5 MG PO TABS
5.0000 mg | ORAL_TABLET | Freq: Every evening | ORAL | Status: DC | PRN
  Administered 2013-10-23: 5 mg via ORAL
  Filled 2013-10-23: qty 1

## 2013-10-23 MED ORDER — HYDROMORPHONE 0.3 MG/ML IV SOLN
INTRAVENOUS | Status: DC
  Administered 2013-10-23: 0.6 mg via INTRAVENOUS
  Administered 2013-10-23: 18:00:00 via INTRAVENOUS

## 2013-10-23 MED ORDER — FENTANYL CITRATE 0.05 MG/ML IJ SOLN
25.0000 ug | INTRAMUSCULAR | Status: DC | PRN
  Administered 2013-10-23: 50 ug via INTRAVENOUS

## 2013-10-23 MED ORDER — MIDAZOLAM HCL 2 MG/2ML IJ SOLN
INTRAMUSCULAR | Status: AC
  Filled 2013-10-23: qty 2

## 2013-10-23 SURGICAL SUPPLY — 48 items
ADH SKN CLS APL DERMABOND .7 (GAUZE/BANDAGES/DRESSINGS) ×2
BARRIER ADHS 3X4 INTERCEED (GAUZE/BANDAGES/DRESSINGS) IMPLANT
BRR ADH 4X3 ABS CNTRL BYND (GAUZE/BANDAGES/DRESSINGS)
CANISTER SUCT 3000ML (MISCELLANEOUS) ×3 IMPLANT
CHLORAPREP W/TINT 26ML (MISCELLANEOUS) ×3 IMPLANT
CLOTH BEACON ORANGE TIMEOUT ST (SAFETY) ×3 IMPLANT
COVER MAYO STAND STRL (DRAPES) ×3 IMPLANT
DERMABOND ADVANCED (GAUZE/BANDAGES/DRESSINGS) ×1
DERMABOND ADVANCED .7 DNX12 (GAUZE/BANDAGES/DRESSINGS) ×2 IMPLANT
DISSECTOR BLUNT TIP ENDO 5MM (MISCELLANEOUS) IMPLANT
DRAPE HYSTEROSCOPY (DRAPE) ×3 IMPLANT
EVACUATOR SMOKE 8.L (FILTER) ×6 IMPLANT
GLOVE BIO SURGEON STRL SZ7.5 (GLOVE) ×4 IMPLANT
GLOVE BIOGEL PI IND STRL 7.5 (GLOVE) ×4 IMPLANT
GLOVE BIOGEL PI INDICATOR 7.5 (GLOVE) ×2
GOWN STRL REUS W/ TWL XL LVL3 (GOWN DISPOSABLE) ×2 IMPLANT
GOWN STRL REUS W/TWL LRG LVL3 (GOWN DISPOSABLE) ×9 IMPLANT
GOWN STRL REUS W/TWL XL LVL3 (GOWN DISPOSABLE) ×9
HEMOSTAT SURGICEL 2X14 (HEMOSTASIS) IMPLANT
NEEDLE INSUFFLATION 120MM (ENDOMECHANICALS) ×3 IMPLANT
NS IRRIG 1000ML POUR BTL (IV SOLUTION) ×3 IMPLANT
OCCLUDER COLPOPNEUMO (BALLOONS) ×3 IMPLANT
PACK LAPAROSCOPY BASIN (CUSTOM PROCEDURE TRAY) ×3 IMPLANT
PACK VAGINAL WOMENS (CUSTOM PROCEDURE TRAY) ×3 IMPLANT
SCALPEL HARMONIC ACE (MISCELLANEOUS) ×1 IMPLANT
SCISSORS LAP 5X35 DISP (ENDOMECHANICALS) IMPLANT
SET CYSTO W/LG BORE CLAMP LF (SET/KITS/TRAYS/PACK) ×3 IMPLANT
SET IRRIG TUBING LAPAROSCOPIC (IRRIGATION / IRRIGATOR) ×3 IMPLANT
SOLUTION ELECTROLUBE (MISCELLANEOUS) IMPLANT
STRIP CLOSURE SKIN 1/4X4 (GAUZE/BANDAGES/DRESSINGS) IMPLANT
SUT MNCRL AB 3-0 PS2 27 (SUTURE) ×9 IMPLANT
SUT PDS AB 1 CT1 36 (SUTURE) ×4 IMPLANT
SUT VICRYL 0 TIES 12 18 (SUTURE) IMPLANT
SUT VICRYL 0 UR6 27IN ABS (SUTURE) ×6 IMPLANT
SYR 50ML LL SCALE MARK (SYRINGE) ×3 IMPLANT
TIP UTERINE 5.1X6CM LAV DISP (MISCELLANEOUS) ×1 IMPLANT
TIP UTERINE 6.7X10CM GRN DISP (MISCELLANEOUS) IMPLANT
TIP UTERINE 6.7X6CM WHT DISP (MISCELLANEOUS) IMPLANT
TIP UTERINE 6.7X8CM BLUE DISP (MISCELLANEOUS) IMPLANT
TOWEL OR 17X24 6PK STRL BLUE (TOWEL DISPOSABLE) ×6 IMPLANT
TRAY FOLEY CATH 14FR (SET/KITS/TRAYS/PACK) ×3 IMPLANT
TROCAR BALLN 12MMX100 BLUNT (TROCAR) IMPLANT
TROCAR XCEL NON-BLD 11X100MML (ENDOMECHANICALS) ×6 IMPLANT
TROCAR XCEL NON-BLD 5MMX100MML (ENDOMECHANICALS) ×4 IMPLANT
TROCAR XCEL OPT SLVE 5M 100M (ENDOMECHANICALS) ×4 IMPLANT
TUBING FILTER THERMOFLATOR (ELECTROSURGICAL) ×3 IMPLANT
WARMER LAPAROSCOPE (MISCELLANEOUS) ×3 IMPLANT
WATER STERILE IRR 1000ML POUR (IV SOLUTION) ×2 IMPLANT

## 2013-10-23 NOTE — Anesthesia Preprocedure Evaluation (Signed)
Anesthesia Evaluation  Patient identified by MRN, date of birth, ID band Patient awake    Reviewed: Allergy & Precautions, H&P , Patient's Chart, lab work & pertinent test results, reviewed documented beta blocker date and time   Airway Mallampati: II  TM Distance: >3 FB Neck ROM: full    Dental no notable dental hx.    Pulmonary Current Smoker,  breath sounds clear to auscultation  Pulmonary exam normal       Cardiovascular Rhythm:regular Rate:Normal     Neuro/Psych    GI/Hepatic   Endo/Other    Renal/GU      Musculoskeletal   Abdominal   Peds  Hematology   Anesthesia Other Findings   Reproductive/Obstetrics                             Anesthesia Physical Anesthesia Plan  ASA: II  Anesthesia Plan: General   Post-op Pain Management:    Induction: Intravenous  Airway Management Planned: Oral ETT  Additional Equipment:   Intra-op Plan:   Post-operative Plan: Extubation in OR  Informed Consent: I have reviewed the patients History and Physical, chart, labs and discussed the procedure including the risks, benefits and alternatives for the proposed anesthesia with the patient or authorized representative who has indicated his/her understanding and acceptance.   Dental Advisory Given and Dental advisory given  Plan Discussed with: CRNA and Surgeon  Anesthesia Plan Comments: (  Discussed general anesthesia, including possible nausea, instrumentation of airway, sore throat,pulmonary aspiration, etc. I asked if the were any outstanding questions, or  concerns before we proceeded. )        Anesthesia Quick Evaluation  

## 2013-10-23 NOTE — H&P (View-Only) (Signed)
Patricia Olson is a 33 y.o. female  P 4-0-0-4 presents for hysterectomy because of menorrhagia. For the past 5 years the patient reports heavy menstrual bleeding with clots and subsequently underwent an endometrial ablation in 2012.  Six months following that procedure, however, she began to resume heavy bleeding.  Menstrual flow lasts for 3 days with clots and pad change 4 times a day, .accompanied by  clots.  She experiences cramping rated as 3-6/10 on a 10 point pain scale but relieved with Midol.  She also reports bleeding after intercourse and at times with bowel movements. She denies urinary tract symptoms, intermenstrual bleeding, vaginitis symptoms  or dyspareunia. A pelvic ultrasound/sono-hysteorgram  in June 2012 was normal with no endometrial masses. (uterus: 7.65 x 4.66 x 3.30 cm with normal ovaries).  A review of both medical and surgical management options were given to the patient however, given the failure of her ablation procedure and the disruptive nature of her symptoms she has chosen definitive therapy in the form of hysterectomy.     Past Medical History  OB History: G4:  P 4-0-0-4,  all vaginal deliveries; (first two deliveries placed for adoption)  GYN History: menarche: 33 YO    LMP : 09/13/2013;   Contracepton bilateral tubal ligation  The patient reports a past history of: chlamydia, gonorrhea, herpes and HPV.  Has a history of an  abnormal PAP smear treated with LEEP procedure;   Last PAP smear: July 2014-normal  Medical History: Goiter/Thyroid Disease, Right Parotid Gland Stone, Stress Urinary Incontinence, ADHD, Bipolar Disorder, GERD, Right Foot Heel Spur, and Anemia,  Surgical History: 2002 Loop Electrosurgical Excision Procedure;  2010 Tubal Sterilization  2012 Novasure Endometrial Ablation and 2013 Bladder Suspension Denies problems with anesthesia or history of blood transfusions  Family History: Metastatic Breast Cancer, Bone Cancer, Depression, Asthma, Diabetes  Mellitus, Hypertension, Anemia, Migraines and Lung Cancer  Social History: 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   ROS:  Denies headache, vision changes, nasal congestion, dysphagia, tinnitus, dizziness, hoarseness, cough,  chest pain, shortness of breath, nausea, vomiting, diarrhea,constipation,  urinary frequency, urgency  dysuria, hematuria, vaginitis symptoms, pelvic pain, swelling of joints,easy bruising,  myalgias, arthralgias, skin rashes, unexplained weight loss and except as is mentioned in the history of present illness, patient's review of systems is otherwise negative.   Physical Exam  Bp: 98/60;  P: 80   R: 14   Temperature: 98.7 degrees F orally   Weight: 166 lbs.  Height:  5'6"     BMI: 26.8   Neck: supple without masses or thyromegaly Lungs: clear to auscultation Heart: regular rate and rhythm Abdomen: soft, non-tender and no organomegaly Pelvic:EGBUS- wnl; vagina-normal rugae; uterus-normal size, cervix without lesions or motion tenderness; adnexae-no tenderness or masses Extremities:  no clubbing, cyanosis or edema  Endometrial Biopsy was unable to be completed due to post ablation scarring of the endometrium    Assesment: Menorrhagia            S/P Endometrial Ablation   Disposition:  A discussion was held with patient regarding the indication for her procedure(s) along with the risks, which include but are not limited to: reaction to anesthesia, damage to adjacent organs, infection and excessive bleeding. the patient verbalized understanding of these risks and has consented  to proceed with a Total Laparosocpic Hysterectomy with Bilateral Salpingectomy, Cystoscopy, Possible Laparoscopically Assisted Hysterectomy and Possible Total Abdominal  Hysterectomy at [facility] on October 23, 2013 at 1 p.m.   CSN# [id number]   Lavenia Stumpo J. Lowell Guitar, PA-C  for Dr. Woodroe Mode. Su Hilt

## 2013-10-23 NOTE — Op Note (Addendum)
Preop Diagnosis: 1.Menorrhagia 2.Dyspareunia 3.h/o Endometrial Ablation  Postop Diagnosis: 1.Menorrhagia 2.Dyspareunia 3.h/o Endometrial Ablation  Procedure: TOTAL LAPAROSCOPIC HYSTERECTOMY  BILATERAL SALPINGECTOMY CYSTOSCOPY   Anesthesia: General   Anesthesiologist: Cristela BlueKyle Jackson, MD   Attending: Purcell NailsAngela Y Hallel Denherder, MD   Assistant: Jaymes GraffNaima Dillard, MD  Findings: Uterus and cervix weighing less than 250g, nl bilateral ovaries and tubes.  Pathology: Uterus, cervix and bilateral fallopian tubes  Fluids: 2000 cc  UOP: 100 cc  EBL: 75 cc  Complications: None  Procedure: The patient was taken to the operating room, placed under general anesthesia and prepped and draped in the normal sterile fashion. A Foley catheter was placed in the bladder. The uterus was not sounded because I thought I perforated once I reached 12cm.  There was a little difficulty when initially placing sound secondary to scar tissue in the uterus s/p ablation.  A weighted speculum and vaginal retractors were placed in the vagina. Tenaculum was placed on the anterior lip of the cervix.  A size 6 cm tip was used and the rumi was placed, tip balloon and occluder insufflated. Attention was then turned to the abdomen. A 10 mm infraumbilical incision was made with the scalpel after 5 cc of 0.25% percent Marcaine was used for local anesthesia. The Veress needle was placed in the intra-abdominal cavity and insufflation obtained.  A 10 mm trochar was advanced into the intra-abdominal cavity and the laparoscope was introduced.  Two 5 mm trochars were placed in the right and left lower quadrants under direct visualization with the laparoscope and a 10 mm trochar was placed in the suprapubic area under direct visualization as well.  The right fallopian tube was excised with the harmonic.  The harmonic scalpel was then used to cauterize and cut the uterine ovarian ligament on the right as well as the round ligament.  The same was done  on the contralateral side.  The uterine artery on the right was cauterized and cut with harmonic scalpel.  The harmonic was then used to circumscribe the [facility] ring and free the uterus and cervix on the right. The same was done on the contralateral side.  The uterus was then pulled into the vagina.  Both angles were sutured with 1 PDS.  The  remainder of the cuff was sutured with 1 PDS using interrupted stitches until the vaginal cuff was closed.  A total of approximately 6 sutures were used.   Irrigation was performed.  Gas was allowed to leave the abdomen to check for bleeders and all pedicles were seen to be hemostatic the patient was given methylene.  The vagina was inspected and the cuff was noted to be intact but the mucosa was not approximated in 2 locations so additional stitches of 0 vicryl were placed vaginally.   Cystoscopy was performed and both ureters were seen to efflux methylene blue without difficulty. The bladder had full integrity with no suture or laceration visualized.  Attention was then turned back to the abdomen after removing top pair of gloves. The abdomen was reinsufflated with CO2 gas.   The abdomen and pelvis was copiously irrigated and  hemostasis was noted.  Small oozing behind the bladder noted and gelfoam placed.  All trochars were removed under direct visualization using the laparoscope.  The two 10mm fascial incisions were reapproximated with 0 vicryl. The two 10 mm incisions were closed with 3-0 Monocryl via a subcuticular stitch.  All remaining skin incisions were closed with Dermabond and the 10 mm skin incisions were  reinforced using Dermabond.  Sponge lap and needle counts were correct.  The patient tolerated the procedure well and was returned to the PACU in stable condition

## 2013-10-23 NOTE — Progress Notes (Signed)
Pt came to room 304 from PACU... Pt immediately complaining that she is in pain and no pain med that she has been given has helped her. Attempted to change pt into clean gown and she immediately wanted to put her own clothes. Pt stated she did not want to have "thing in nose" and did not want to wear pulse ox on her finger... Pt demanding to go home and have foley catheter removed.. Dr. Su Hiltoberts notified... See orders... PCA started and pt immediately stated that the pain med was still not working and she wanted it to be removed... Dr. Su Hiltoberts again notified and stated to put pt on regular diet and try vicodin... Pt refused to take vicodin stating that she knew it would not help her stomach pain, and that she would just lay there in pain until her husband got here and she would go home. Dr. Su Hiltoberts again notified and orders given for IV push morphine... Will continue to monitor pt.

## 2013-10-23 NOTE — Transfer of Care (Signed)
Immediate Anesthesia Transfer of Care Note  Patient: Patricia Olson  Procedure(s) Performed: Procedure(s): HYSTERECTOMY TOTAL LAPAROSCOPIC (N/A) CYSTOSCOPY (Bilateral)  Patient Location: PACU  Anesthesia Type:General  Level of Consciousness: awake, alert  and oriented  Airway & Oxygen Therapy: Patient Spontanous Breathing and Patient connected to nasal cannula oxygen  Post-op Assessment: Report given to PACU RN and Post -op Vital signs reviewed and stable  Post vital signs: Reviewed and stable  Complications: No apparent anesthesia complications

## 2013-10-23 NOTE — Anesthesia Postprocedure Evaluation (Signed)
  Anesthesia Post-op Note  Patient: Patricia Olson  Procedure(s) Performed: Procedure(s): HYSTERECTOMY TOTAL LAPAROSCOPIC (N/A) CYSTOSCOPY (Bilateral)  Patient is awake, responsive, moving her legs, and has signs of resolution of her numbness. Pain and nausea are reasonably well controlled. Vital signs are stable and clinically acceptable. Oxygen saturation is clinically acceptable. There are no apparent anesthetic complications at this time. Patient is ready for discharge.

## 2013-10-23 NOTE — Interval H&P Note (Signed)
History and Physical Interval Note:  10/23/2013 1:12 PM  Patricia Olson  has presented today for surgery, with the diagnosis of Menorrhagia  The various methods of treatment have been discussed with the patient and family. After consideration of risks, benefits and other options for treatment, the patient has consented to  Procedure(s) with comments: HYSTERECTOMY TOTAL LAPAROSCOPIC (N/A) BILATERAL SALPINGECTOMY POSSIBLE LAPAROSCOPICALLY ASSISTED VAGINAL HYSTERECTOMY POSSIBLE TOTAL ABDOMINAL HYSTERECTOMY CYSTOSCOPY (Bilateral) as a surgical intervention .  The patient's history has been reviewed, patient examined, no change in status, stable for surgery.  I have reviewed the patient's chart and labs.  Questions were answered to the patient's satisfaction.     Purcell NailsOBERTS,[name]

## 2013-10-23 NOTE — Progress Notes (Signed)
House coverage, Domenick Bookbinderanya Stallings attempted to go in and restart patient's IV. Vein was located on patient's L arm and patient stated " I don't want an IV in that arm". Attempted to find site in R arm and patient stated tourniquet "was too tight" and she  "didn't like where IV would be located in R arm". CRNA paged and asked to start IV. told that this would be the last attempt at IV after CRNA attempted. Will continue to monitor.

## 2013-10-23 NOTE — Progress Notes (Signed)
5 mg of morphine given IV push at 1857, O2 sats 100% while pain med pushed... Pt immediately stating that she "felt nothing" and that morphine usually worked "immediately" for her... Pt educated about IV push pain medicine... Pt stating that RN must have "pushed water through her IV" because she knew what it felt like to have morphine and it was not helping her pain... Pt demanding to see or speak to Dr. Su Hiltoberts, Dr. Su Hiltoberts notified and phone call transferred into pt's room at 1915... Will continue to monitor.

## 2013-10-24 ENCOUNTER — Encounter (HOSPITAL_COMMUNITY): Payer: Self-pay | Admitting: Obstetrics and Gynecology

## 2013-10-24 LAB — URINALYSIS W MICROSCOPIC (NOT AT ARMC)
BILIRUBIN URINE: NEGATIVE
Glucose, UA: NEGATIVE mg/dL
Ketones, ur: NEGATIVE mg/dL
Leukocytes, UA: NEGATIVE
Nitrite: NEGATIVE
PROTEIN: NEGATIVE mg/dL
SPECIFIC GRAVITY, URINE: 1.01 (ref 1.005–1.030)
UROBILINOGEN UA: 0.2 mg/dL (ref 0.0–1.0)
pH: 5.5 (ref 5.0–8.0)

## 2013-10-24 LAB — CBC
HCT: 35 % — ABNORMAL LOW (ref 36.0–46.0)
Hemoglobin: 12 g/dL (ref 12.0–15.0)
MCH: 28.6 pg (ref 26.0–34.0)
MCHC: 34.3 g/dL (ref 30.0–36.0)
MCV: 83.5 fL (ref 78.0–100.0)
Platelets: 179 10*3/uL (ref 150–400)
RBC: 4.19 MIL/uL (ref 3.87–5.11)
RDW: 12 % (ref 11.5–15.5)
WBC: 10.6 10*3/uL — ABNORMAL HIGH (ref 4.0–10.5)

## 2013-10-24 MED ORDER — NICOTINE 21 MG/24HR TD PT24: 21.0000 mg | MEDICATED_PATCH | Freq: Every day | TRANSDERMAL | Status: DC

## 2013-10-24 MED ORDER — MAGIC MOUTHWASH W/LIDOCAINE: 2.0000 mL | Freq: Once | ORAL | Status: DC

## 2013-10-24 MED ORDER — IBUPROFEN 600 MG PO TABS: ORAL_TABLET | ORAL | Status: DC

## 2013-10-24 MED ORDER — MAGIC MOUTHWASH
15.0000 mL | Freq: Once | ORAL | Status: AC
  Administered 2013-10-24: 15 mL via ORAL
  Filled 2013-10-24: qty 15

## 2013-10-24 MED ORDER — HYDROMORPHONE HCL 2 MG PO TABS: 2.0000 mg | ORAL_TABLET | Freq: Four times a day (QID) | ORAL | Status: DC | PRN

## 2013-10-24 MED ORDER — HYDROMORPHONE HCL 2 MG PO TABS
2.0000 mg | ORAL_TABLET | Freq: Four times a day (QID) | ORAL | Status: DC | PRN
  Administered 2013-10-24: 4 mg via ORAL
  Filled 2013-10-24: qty 2

## 2013-10-24 MED ORDER — ONDANSETRON HCL 4 MG PO TABS: 4.0000 mg | ORAL_TABLET | Freq: Three times a day (TID) | ORAL | Status: DC | PRN

## 2013-10-24 MED ORDER — HYDROCODONE-ACETAMINOPHEN 5-325 MG PO TABS: 1.0000 | ORAL_TABLET | Freq: Four times a day (QID) | ORAL | Status: DC | PRN

## 2013-10-24 MED ORDER — TRAMADOL HCL 50 MG PO TABS: ORAL_TABLET | ORAL | Status: DC

## 2013-10-24 MED ORDER — KETOROLAC TROMETHAMINE 10 MG PO TABS
10.0000 mg | ORAL_TABLET | Freq: Four times a day (QID) | ORAL | Status: DC
  Administered 2013-10-24: 10 mg via ORAL
  Filled 2013-10-24 (×6): qty 1

## 2013-10-24 MED ORDER — DOCUSATE SODIUM 100 MG PO CAPS
100.0000 mg | ORAL_CAPSULE | Freq: Three times a day (TID) | ORAL | Status: DC
  Administered 2013-10-24: 100 mg via ORAL
  Filled 2013-10-24: qty 1

## 2013-10-24 MED ORDER — TRAMADOL HCL 50 MG PO TABS
100.0000 mg | ORAL_TABLET | Freq: Four times a day (QID) | ORAL | Status: DC
  Administered 2013-10-24: 100 mg via ORAL
  Filled 2013-10-24: qty 2

## 2013-10-24 MED ORDER — BISACODYL 10 MG RE SUPP
10.0000 mg | Freq: Once | RECTAL | Status: AC
  Administered 2013-10-24: 10 mg via RECTAL
  Filled 2013-10-24: qty 1

## 2013-10-24 NOTE — Progress Notes (Signed)
Patricia BeardsCori F Olson is a32 y.o.  [id number]  Post Op Date # 1:  S/P  TLH/BS/Cystoscopy  Subjective: Patient is Postoperative course complicated by complaints of poor pain control. Patient has received Vicodin, Percocet and Morphine but states that none have helped her abdominal and (chronic lower back pain) Patient has ambulated in the room, voided without difficulty, tolerrating liquids and crackers.,    Objective: Vital signs in last 24 hours: Temp:  [97.4 F (36.3 C)-98.2 F (36.8 C)] 98.2 F (36.8 C) (02/25 0459) Pulse Rate:  [55-90] 90 (02/25 0459) Resp:  [14-20] 18 (02/25 0459) BP: (102-123)/(51-81) 112/78 mmHg (02/25 0459) SpO2:  [97 %-100 %] 97 % (02/25 0459) Weight:  [167 lb (75.751 kg)] 167 lb (75.751 kg) (02/24 1955)  Intake/Output from previous day: 02/24 0701 - 02/25 0700 In: 2423.1 [I.V.:2423.1] Out: 775 [Urine:700] Intake/Output this shift:    Recent Labs Lab 10/19/13 1448 10/24/13 0505  WBC 6.9 10.6*  HGB 13.1 12.0  HCT 38.3 35.0*  PLT 172 179    No results found for this basename: NA, K, CL, CO2, BUN, CREATININE, CALCIUM, LABALBU, PROT, BILITOT, ALKPHOS, ALT, AST, GLUCOSE,  in the last 168 hours  EXAM: General: alert and mild distress Resp: clear to auscultation bilaterally Cardio: regular rate and rhythm, S1, S2 normal, no murmur, click, rub or gallop GI: Soft, bowel sounds present, incisions intact without evidence of infection Extremities: No calf tenderness and negative Homan's sign Vaginal Bleeding: none   Assessment: s/p Procedure(s): HYSTERECTOMY TOTAL LAPAROSCOPIC CYSTOSCOPY: stable and poor pain management.  Plan: Advance diet Encourage ambulation To change analgesia to Tramadol and Ketoralac Will give Dulcolax and prescribe daily stool softeners as the patient reports constipation in the past with narcotics  LOS: 1 day    An Schnabel, PA-C 10/24/2013 7:46 AM

## 2013-10-24 NOTE — Discharge Instructions (Signed)
Call [facility] @ [phone] if:  You have a temperature greater than or equal to 100.4 degrees Farenheit orally You have pain that is not made better by the pain medication given and taken as directed You have excessive bleeding or problems urinating  Take Colace (Docusate Sodium/Stool Softener) 100 mg 2-3 times daily while taking narcotic pain medicine to avoid constipation or until bowel movements are regular.  Take, with food, every 6 hours for 5 days,  Ibuprofen 600 mg  You may drive after 1 week You may walk up steps You may shower tomorrow You may resume a regular diet Keep incisions clean and dry Do not lift over 15 pounds for 6 weeks Avoid anything in vagina for 6 weeks (or until after your post-operative visit)

## 2013-10-24 NOTE — Discharge Summary (Signed)
Physician Discharge Summary  Patient ID: Patricia BeardsCori F Heathman MRN: [id number] DOB/AGE: 1981/05/08 33 y.o.  Admit date: 10/23/2013 Discharge date: 10/24/2013   Discharge Diagnoses: Menorrhagia and S/P Endometrial Ablation Active Problems:   S/P laparoscopic hysterectomy   Operation: Total Laparoscopic Hysterectomy, Bilateral Salpingectomy and Cystoscopy   Discharged Condition: Good  Hospital Course: On the date of admission the patient underwent the aforementioned procedures and tolerated them well,  Post operative course was marked by difficulty in pain management however, ultimately good analgesia was achieved with Dilaudid and Ibuprofen.             Patient's post operative hemoglobin and hematocrit was 12/35 respectively and by post operative day # 1 she had resumed bowel and bladder function and was therefore deemed ready for discharge home.  Disposition: Home to self care Discharge Medications:    Medication List    ASK your doctor about these medications       diclofenac 75 MG EC tablet  Commonly known as:  VOLTAREN  Take 75 mg by mouth 2 (two) times daily as needed. For pain from bone spur.     HYDROcodone-acetaminophen 5-325 MG per tablet  Commonly known as:  NORCO/VICODIN  Take 1 tablet by mouth every 6 (six) hours as needed.     meloxicam 15 MG tablet  Commonly known as:  MOBIC  Take 15 mg by mouth daily as needed. For pain.     QUEtiapine 50 MG tablet  Commonly known as:  SEROQUEL  Take 50 mg by mouth at bedtime.          Follow-up: Dr. Woodroe ModeAngela Y. Su Hiltoberts,  November 29, 2013 at 9:15 a.m.   SignedHenreitta Leber: POWELL,ELMIRA, PA-C 10/24/2013, 8:05 AM

## 2013-10-24 NOTE — Anesthesia Postprocedure Evaluation (Signed)
Anesthesia Post Note  Patient: Patricia Olson  Procedure(s) Performed: Procedure(s) (LRB): HYSTERECTOMY TOTAL LAPAROSCOPIC (N/A) CYSTOSCOPY (Bilateral)  Anesthesia type: General  Patient location: Mother/Baby  Post pain: Pain level controlled  Post assessment: Post-op Vital signs reviewed  Last Vitals:  Filed Vitals:   10/24/13 0459  BP: 112/78  Pulse: 90  Temp: 36.8 C  Resp: 18    Post vital signs: Reviewed  Level of consciousness: awake and alert   Complications: No apparent anesthesia complications

## 2013-10-24 NOTE — Progress Notes (Signed)
Pt d/c home  Teaching complete   

## 2013-10-24 NOTE — Addendum Note (Signed)
Addendum created 10/24/13 [id number] by Jhonnie GarnerBeth M Sabien Umland, CRNA   Modules edited: Notes Section   Notes Section:  File: [id number]

## 2013-10-25 LAB — URINE CULTURE
COLONY COUNT: NO GROWTH
Culture: NO GROWTH

## 2013-10-26 ENCOUNTER — Inpatient Hospital Stay (HOSPITAL_COMMUNITY)
Admission: AD | Admit: 2013-10-26 | Discharge: 2013-10-27 | Disposition: A | Discharge status: Home / Self Care | Payer: Medicaid Other | Source: Ambulatory Visit | Attending: Obstetrics and Gynecology | Admitting: Obstetrics and Gynecology

## 2013-10-26 ENCOUNTER — Encounter (HOSPITAL_COMMUNITY): Payer: Self-pay | Admitting: *Deleted

## 2013-10-26 DIAGNOSIS — Z90710 Acquired absence of both cervix and uterus: Secondary | ICD-10-CM | POA: Insufficient documentation

## 2013-10-26 DIAGNOSIS — K5900 Constipation, unspecified: Secondary | ICD-10-CM | POA: Insufficient documentation

## 2013-10-26 DIAGNOSIS — Z9889 Other specified postprocedural states: Secondary | ICD-10-CM | POA: Insufficient documentation

## 2013-10-26 LAB — URINE MICROSCOPIC-ADD ON

## 2013-10-26 LAB — COMPREHENSIVE METABOLIC PANEL
ALT: 8 U/L (ref 0–35)
AST: 12 U/L (ref 0–37)
Albumin: 3.6 g/dL (ref 3.5–5.2)
Alkaline Phosphatase: 74 U/L (ref 39–117)
BUN: 12 mg/dL (ref 6–23)
CALCIUM: 8.8 mg/dL (ref 8.4–10.5)
CO2: 28 mEq/L (ref 19–32)
CREATININE: 0.73 mg/dL (ref 0.50–1.10)
Chloride: 101 mEq/L (ref 96–112)
GFR calc non Af Amer: >90 mL/min (ref >90)
GLUCOSE: 92 mg/dL (ref 70–99)
Potassium: 4 mEq/L (ref 3.7–5.3)
Sodium: 140 mEq/L (ref 137–147)
Total Bilirubin: 0.2 mg/dL — ABNORMAL LOW (ref 0.3–1.2)
Total Protein: 6.5 g/dL (ref 6.0–8.3)

## 2013-10-26 LAB — CBC WITH DIFFERENTIAL/PLATELET
Basophils Absolute: 0 10*3/uL (ref 0.0–0.1)
Basophils Relative: 0 % (ref 0–1)
EOS ABS: 0.1 10*3/uL (ref 0.0–0.7)
EOS PCT: 2 % (ref 0–5)
HEMATOCRIT: 34.4 % — AB (ref 36.0–46.0)
Hemoglobin: 11.8 g/dL — ABNORMAL LOW (ref 12.0–15.0)
LYMPHS ABS: 2.7 10*3/uL (ref 0.7–4.0)
LYMPHS PCT: 34 % (ref 12–46)
MCH: 28.7 pg (ref 26.0–34.0)
MCHC: 34.3 g/dL (ref 30.0–36.0)
MCV: 83.7 fL (ref 78.0–100.0)
MONO ABS: 0.4 10*3/uL (ref 0.1–1.0)
Monocytes Relative: 6 % (ref 3–12)
Neutro Abs: 4.6 10*3/uL (ref 1.7–7.7)
Neutrophils Relative %: 58 % (ref 43–77)
Platelets: 175 10*3/uL (ref 150–400)
RBC: 4.11 MIL/uL (ref 3.87–5.11)
RDW: 11.9 % (ref 11.5–15.5)
WBC: 7.9 10*3/uL (ref 4.0–10.5)

## 2013-10-26 LAB — URINALYSIS, ROUTINE W REFLEX MICROSCOPIC
BILIRUBIN URINE: NEGATIVE
Glucose, UA: NEGATIVE mg/dL
Ketones, ur: NEGATIVE mg/dL
Nitrite: NEGATIVE
Protein, ur: NEGATIVE mg/dL
SPECIFIC GRAVITY, URINE: 1.025 (ref 1.005–1.030)
UROBILINOGEN UA: 0.2 mg/dL (ref 0.0–1.0)
pH: 6 (ref 5.0–8.0)

## 2013-10-26 MED ORDER — MORPHINE SULFATE 10 MG/ML IJ SOLN
10.0000 mg | Freq: Once | INTRAMUSCULAR | Status: AC
  Administered 2013-10-26: 10 mg via INTRAMUSCULAR
  Filled 2013-10-26: qty 1
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
## 2013-10-26 NOTE — Progress Notes (Signed)
Pt has been very anxious and fearful of everything. Questioning care. Wanting to know if the enema, SSE ,enema, MS injections would hurt. Not understanding why she is still hurting 3 days after surgery. Tried explaining that her body is healing and it would hurt some after surgery and constipation and gas pain are very uncomfortable as well. Pt still anxious will have MIdwife or MD talk with her again.

## 2013-10-26 NOTE — MAU Note (Signed)
Had vaginal hysterectomy on Tues and home Weds. Feels like my stomach is tightening and loosening, stabbing pain in lower abd and back. Small BM on Weds but unable to go since.

## 2013-10-27 MED ORDER — KETOROLAC TROMETHAMINE 60 MG/2ML IM SOLN
60.0000 mg | Freq: Once | INTRAMUSCULAR | Status: AC
  Administered 2013-10-27: 60 mg via INTRAMUSCULAR
  Filled 2013-10-27: qty 2

## 2013-10-27 NOTE — Discharge Instructions (Signed)
Preventing Constipation After Surgery Constipation is when a person has fewer than 3 bowel movements a week; has difficulty having a bowel movement; or has stools that are dry, hard, or larger than normal. Many things can make constipation likely after surgery. They include:  Medications, especially numbing medications (anesthetics) and very strong pain medications called narcotics.  Feeling stressed because of the surgery.  Eating different foods than normal.  Being less active. Symptoms of constipation include:  Having fewer than 3 bowel movements a week.  Straining to have a bowel movement.  Having hard, dry, or larger-than-normal stools.  Feeling full or bloated.  Having pain in the lower abdomen.  Not feeling relief after having a bowel movement. HOME CARE INSTRUCTIONS  Diet  Eat foods that have a lot of fiber. These include fruits, vegetables, whole grains, and beans. Limit foods high in fat and processed sugars. These include french fries, hamburgers, cookies, and candy.  Take a fiber supplement as directed. If you are not taking a fiber supplement and think that you are not getting enough fiber from foods, talk to your caregiver about adding a fiber supplement to your diet.  Drink clear fluids, especially water. Avoid drinking alcohol, caffeine, and soda. These can make constipation worse.  Drink enough fluids to keep your urine clear or pale yellow. Activity   After surgery, return to your normal activities slowly or when your caregiver says it is okay.  Start walking as soon as you can. Try to go a little farther each day.  Once your caregiver approves, do some sort of regular exercise. This helps prevent constipation. Bowel Movements  Go to the restroom when you have the urge to go. Do not hold it in.  Try drinking something hot to get a bowel movement started.  Keep track of how often you use the restroom. If you miss 2 3 bowel movements, talk to your  caregiver about medications that prevent constipation. Your caregiver may suggest a stool softener, laxative, or fiber supplement.  Only take over-the-counter or prescription medications as directed by your caregiver.  Do not take other medications without talking to your caregiver first. If you become constipated and take a medication to make you have a bowel movement, the problem may get worse. Other kinds of medication can also make the problem worse. SEEK MEDICAL CARE IF:  You used stool softeners or laxatives and still have not had a bowel movement within 24 48 hours after using them.  You have not had a bowel movement in 3 days. SEEK IMMEDIATE MEDICAL CARE IF:   Your constipation lasts for more than 4 days or gets worse.  You have bright red blood in your stool.  You have abdominal or rectal pain.  You have very bad cramping.  You have thin, pencil-like stools.  You have unexplained weight loss.  You have a fever or persistent symptoms for more than 2 3 days.  You have a fever and your symptoms suddenly get worse. MAKE SURE YOU:  Understand these instructions.  Will watch your condition.  Will get help right away if you are not doing well or get worse. Document Released: 12/11/2012 Document Reviewed: 12/11/2012 [facility] Patient Information 2014 [place], [place].   Total Laparoscopic Hysterectomy, Care After Refer to this sheet in the next few weeks. These instructions provide you with information on caring for yourself after your procedure. Your health care provider may also give you more specific instructions. Your treatment has been planned according to current  medical practices, but problems sometimes occur. Call your health care provider if you have any problems or questions after your procedure. WHAT TO EXPECT AFTER THE PROCEDURE  Pain and bruising at the incision sites. You will be given pain medicine to control it.  Menopausal symptoms such as hot flashes,  night sweats, and insomnia if your ovaries were removed.  Sore throat from the breathing tube that was inserted during surgery. HOME CARE INSTRUCTIONS  Only take over-the-counter or prescription medicines for pain, discomfort, or fever as directed by your health care provider.   Do not take aspirin. It can cause bleeding.   Do not drive when taking pain medicine.   Follow your health care provider's advice regarding diet, exercise, lifting, driving, and general activities.   Resume your usual diet as directed and allowed.   Get plenty of rest and sleep.   Do not douche, use tampons, or have sexual intercourse for at least 6 weeks, or until your health care provider gives you permission.   Change your bandages (dressings) as directed by your health care provider.   Monitor your temperature and notify your health care provider of a fever.   Take showers instead of baths for 2 3 weeks.   Do not drink alcohol until your health care provider gives you permission.   If you develop constipation, you may take a mild laxative with your health care provider's permission. Bran foods may help with constipation problems. Drinking enough fluids to keep your urine clear or pale yellow may help as well.   Try to have someone home with you for 1 2 weeks to help around the house.   Keep all of your follow-up appointments as directed by your health care provider.  SEEK MEDICAL CARE IF:  You have swelling, redness, or increasing pain around your incision sites.   You have pus coming from your incision.   You notice a bad smell coming from your incision.   Your incision breaks open.   You feel dizzy or lightheaded.   You have pain or bleeding when you urinate.   You have persistent diarrhea.   You have persistent nausea and vomiting.   You have abnormal vaginal discharge.   You have a rash.   You have any type of abnormal reaction or develop an allergy to your  medicine.   You have poor pain control with your prescribed medicine.  SEEK IMMEDIATE MEDICAL CARE IF:  You have chest pain or shortness of breath.  You have severe abdominal pain that is not relieved with pain medicine.  You have pain or swelling in your legs. Document Released: 06/06/2013 Document Reviewed: 03/06/2013 [facility] Patient Information 2014 [place], [place].

## 2013-10-27 NOTE — Progress Notes (Signed)
Sanda KleinShelley Lillard CNM notified pt wanting to go home. Dr Su Hiltoberts saw pt earlier but is not in surgery with another pt. CNM will see pt. Pt aware and agrees.

## 2014-07-01 ENCOUNTER — Encounter (HOSPITAL_COMMUNITY): Payer: Self-pay | Admitting: *Deleted

## 2014-08-03 ENCOUNTER — Emergency Department (HOSPITAL_COMMUNITY)
Admission: EM | Admit: 2014-08-03 | Discharge: 2014-08-03 | Disposition: A | Discharge status: Home / Self Care | Payer: Medicaid Other | Attending: Emergency Medicine | Admitting: Emergency Medicine

## 2014-08-03 ENCOUNTER — Encounter (HOSPITAL_COMMUNITY): Payer: Self-pay | Admitting: *Deleted

## 2014-08-03 DIAGNOSIS — K088 Other specified disorders of teeth and supporting structures: Secondary | ICD-10-CM | POA: Insufficient documentation

## 2014-08-03 DIAGNOSIS — Z79899 Other long term (current) drug therapy: Secondary | ICD-10-CM | POA: Diagnosis not present

## 2014-08-03 DIAGNOSIS — Z8719 Personal history of other diseases of the digestive system: Secondary | ICD-10-CM | POA: Diagnosis not present

## 2014-08-03 DIAGNOSIS — K0889 Other specified disorders of teeth and supporting structures: Secondary | ICD-10-CM

## 2014-08-03 DIAGNOSIS — Z720 Tobacco use: Secondary | ICD-10-CM | POA: Diagnosis not present

## 2014-08-03 DIAGNOSIS — Z8659 Personal history of other mental and behavioral disorders: Secondary | ICD-10-CM | POA: Insufficient documentation

## 2014-08-03 DIAGNOSIS — Z98818 Other dental procedure status: Secondary | ICD-10-CM | POA: Insufficient documentation

## 2014-08-03 DIAGNOSIS — G8929 Other chronic pain: Secondary | ICD-10-CM | POA: Insufficient documentation

## 2014-08-03 DIAGNOSIS — Z862 Personal history of diseases of the blood and blood-forming organs and certain disorders involving the immune mechanism: Secondary | ICD-10-CM | POA: Diagnosis not present

## 2014-08-03 DIAGNOSIS — Z9889 Other specified postprocedural states: Secondary | ICD-10-CM

## 2014-08-03 MED ORDER — OXYCODONE-ACETAMINOPHEN 5-325 MG PO TABS
1.0000 | ORAL_TABLET | Freq: Once | ORAL | Status: AC
  Administered 2014-08-03: 1 via ORAL
  Filled 2014-08-03: qty 1

## 2014-08-03 MED ORDER — OXYCODONE-ACETAMINOPHEN 5-325 MG PO TABS: 2.0000 | ORAL_TABLET | Freq: Once | ORAL | Status: DC

## 2014-08-03 MED ORDER — OXYCODONE-ACETAMINOPHEN 5-325 MG PO TABS: 1.0000 | ORAL_TABLET | Freq: Four times a day (QID) | ORAL | Status: DC | PRN

## 2014-08-03 NOTE — Discharge Instructions (Signed)
Call your oral surgeon. Call for a follow up appointment with a Family or Primary Care Provider.  Return if Symptoms worsen.   Take medication as prescribed.  Soft diet to prevent trauma to gum. Salt water gargles.
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
## 2014-08-03 NOTE — ED Notes (Signed)
Patient states she had a tooth pulled the Tuesday before Thanksgiving.  Left lower back remains swollen and painful.  Her dentist told her if it got worse to come to the ED

## 2015-02-07 ENCOUNTER — Encounter (HOSPITAL_COMMUNITY): Payer: Self-pay | Admitting: Emergency Medicine

## 2015-02-07 DIAGNOSIS — Z862 Personal history of diseases of the blood and blood-forming organs and certain disorders involving the immune mechanism: Secondary | ICD-10-CM | POA: Insufficient documentation

## 2015-02-07 DIAGNOSIS — Z79899 Other long term (current) drug therapy: Secondary | ICD-10-CM | POA: Diagnosis not present

## 2015-02-07 DIAGNOSIS — Z720 Tobacco use: Secondary | ICD-10-CM | POA: Diagnosis not present

## 2015-02-07 DIAGNOSIS — G8929 Other chronic pain: Secondary | ICD-10-CM | POA: Insufficient documentation

## 2015-02-07 DIAGNOSIS — Z8719 Personal history of other diseases of the digestive system: Secondary | ICD-10-CM | POA: Diagnosis not present

## 2015-02-07 DIAGNOSIS — M791 Myalgia: Secondary | ICD-10-CM | POA: Diagnosis present

## 2015-02-07 DIAGNOSIS — F42 Obsessive-compulsive disorder: Secondary | ICD-10-CM | POA: Insufficient documentation

## 2015-02-07 DIAGNOSIS — B349 Viral infection, unspecified: Secondary | ICD-10-CM | POA: Diagnosis not present

## 2015-02-07 DIAGNOSIS — Z3202 Encounter for pregnancy test, result negative: Secondary | ICD-10-CM | POA: Diagnosis not present

## 2015-02-07 DIAGNOSIS — R1310 Dysphagia, unspecified: Secondary | ICD-10-CM | POA: Insufficient documentation

## 2015-02-07 DIAGNOSIS — F329 Major depressive disorder, single episode, unspecified: Secondary | ICD-10-CM | POA: Diagnosis not present

## 2015-02-07 NOTE — ED Notes (Signed)
Pt. presents with multiple complaints : generalized body aches onset yesterday , with headache , chills , sore throat , fatigue and nausea.

## 2015-02-08 ENCOUNTER — Emergency Department (HOSPITAL_COMMUNITY): Payer: Medicaid Other

## 2015-02-08 ENCOUNTER — Emergency Department (HOSPITAL_COMMUNITY)
Admission: EM | Admit: 2015-02-08 | Discharge: 2015-02-08 | Disposition: A | Discharge status: Home / Self Care | Payer: Medicaid Other | Attending: Emergency Medicine | Admitting: Emergency Medicine

## 2015-02-08 DIAGNOSIS — B349 Viral infection, unspecified: Secondary | ICD-10-CM

## 2015-02-08 DIAGNOSIS — R0602 Shortness of breath: Secondary | ICD-10-CM

## 2015-02-08 DIAGNOSIS — R1310 Dysphagia, unspecified: Secondary | ICD-10-CM

## 2015-02-08 DIAGNOSIS — R52 Pain, unspecified: Secondary | ICD-10-CM

## 2015-02-08 DIAGNOSIS — R519 Headache, unspecified: Secondary | ICD-10-CM

## 2015-02-08 DIAGNOSIS — R51 Headache: Secondary | ICD-10-CM

## 2015-02-08 LAB — URINALYSIS, ROUTINE W REFLEX MICROSCOPIC
BILIRUBIN URINE: NEGATIVE
GLUCOSE, UA: NEGATIVE mg/dL
Ketones, ur: NEGATIVE mg/dL
NITRITE: NEGATIVE
Protein, ur: NEGATIVE mg/dL
Specific Gravity, Urine: 1.025 (ref 1.005–1.030)
Urobilinogen, UA: 0.2 mg/dL (ref 0.0–1.0)
pH: 6 (ref 5.0–8.0)

## 2015-02-08 LAB — COMPREHENSIVE METABOLIC PANEL
ALT: 9 U/L — AB (ref 14–54)
ANION GAP: 7 (ref 5–15)
AST: 12 U/L — ABNORMAL LOW (ref 15–41)
Albumin: 3.5 g/dL (ref 3.5–5.0)
Alkaline Phosphatase: 71 U/L (ref 38–126)
BUN: 10 mg/dL (ref 6–20)
CO2: 19 mmol/L — ABNORMAL LOW (ref 22–32)
Calcium: 8.5 mg/dL — ABNORMAL LOW (ref 8.9–10.3)
Chloride: 114 mmol/L — ABNORMAL HIGH (ref 101–111)
Creatinine, Ser: 0.59 mg/dL (ref 0.44–1.00)
GFR calc Af Amer: >60 mL/min (ref >60)
GLUCOSE: 109 mg/dL — AB (ref 65–99)
Potassium: 3.8 mmol/L (ref 3.5–5.1)
Sodium: 140 mmol/L (ref 135–145)
TOTAL PROTEIN: 5.8 g/dL — AB (ref 6.5–8.1)
Total Bilirubin: 0.2 mg/dL — ABNORMAL LOW (ref 0.3–1.2)

## 2015-02-08 LAB — RAPID STREP SCREEN (MED CTR MEBANE ONLY): Streptococcus, Group A Screen (Direct): NEGATIVE

## 2015-02-08 LAB — CBC
HEMATOCRIT: 35.5 % — AB (ref 36.0–46.0)
Hemoglobin: 12.2 g/dL (ref 12.0–15.0)
MCH: 29.8 pg (ref 26.0–34.0)
MCHC: 34.4 g/dL (ref 30.0–36.0)
MCV: 86.8 fL (ref 78.0–100.0)
Platelets: 162 10*3/uL (ref 150–400)
RBC: 4.09 MIL/uL (ref 3.87–5.11)
RDW: 12.6 % (ref 11.5–15.5)
WBC: 8.3 10*3/uL (ref 4.0–10.5)

## 2015-02-08 LAB — URINE MICROSCOPIC-ADD ON

## 2015-02-08 LAB — I-STAT BETA HCG BLOOD, ED (MC, WL, AP ONLY)

## 2015-02-08 MED ORDER — KETOROLAC TROMETHAMINE 30 MG/ML IJ SOLN
30.0000 mg | Freq: Once | INTRAMUSCULAR | Status: AC
  Administered 2015-02-08: 30 mg via INTRAVENOUS
  Filled 2015-02-08: qty 1

## 2015-02-08 MED ORDER — DEXAMETHASONE SODIUM PHOSPHATE 10 MG/ML IJ SOLN
10.0000 mg | Freq: Once | INTRAMUSCULAR | Status: AC
  Administered 2015-02-08: 10 mg via INTRAVENOUS
  Filled 2015-02-08: qty 1

## 2015-02-08 MED ORDER — PROMETHAZINE HCL 25 MG PO TABS: 25.0000 mg | ORAL_TABLET | Freq: Four times a day (QID) | ORAL | Status: DC | PRN

## 2015-02-08 MED ORDER — SODIUM CHLORIDE 0.9 % IV BOLUS (SEPSIS)
1000.0000 mL | Freq: Once | INTRAVENOUS | Status: AC
  Administered 2015-02-08: 1000 mL via INTRAVENOUS

## 2015-02-08 MED ORDER — HYDROCODONE-ACETAMINOPHEN 5-325 MG PO TABS
1.0000 | ORAL_TABLET | Freq: Once | ORAL | Status: AC
  Administered 2015-02-08: 1 via ORAL
  Filled 2015-02-08: qty 1

## 2015-02-08 MED ORDER — METOCLOPRAMIDE HCL 5 MG/ML IJ SOLN
10.0000 mg | INTRAMUSCULAR | Status: AC
Start: 2015-02-08 — End: 2015-02-08
  Administered 2015-02-08: 10 mg via INTRAVENOUS
  Filled 2015-02-08: qty 2

## 2015-02-08 MED ORDER — DICYCLOMINE HCL 10 MG PO CAPS
10.0000 mg | ORAL_CAPSULE | Freq: Once | ORAL | Status: AC
  Administered 2015-02-08: 10 mg via ORAL
  Filled 2015-02-08: qty 1

## 2015-02-08 MED ORDER — NAPROXEN 500 MG PO TABS: 500.0000 mg | ORAL_TABLET | Freq: Two times a day (BID) | ORAL | Status: DC

## 2015-02-08 MED ORDER — NAPROXEN 250 MG PO TABS
500.0000 mg | ORAL_TABLET | Freq: Once | ORAL | Status: AC
  Administered 2015-02-08: 500 mg via ORAL
  Filled 2015-02-08: qty 2

## 2015-02-08 MED ORDER — DICYCLOMINE HCL 20 MG PO TABS: 20.0000 mg | ORAL_TABLET | Freq: Two times a day (BID) | ORAL | Status: DC

## 2015-02-08 NOTE — ED Notes (Signed)
The pa saw before myself see her notes 

## 2015-02-08 NOTE — ED Notes (Signed)
thye pt will not keep her bp cuff inplace or her pulse ox wire.  She did noit want the iv etc

## 2015-02-08 NOTE — ED Notes (Signed)
To x-ray

## 2015-02-08 NOTE — Discharge Instructions (Signed)
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
## 2015-02-10 LAB — CULTURE, GROUP A STREP: Strep A Culture: POSITIVE — AB

## 2015-02-11 ENCOUNTER — Telehealth: Payer: Self-pay | Admitting: Emergency Medicine

## 2015-02-11 NOTE — Progress Notes (Signed)
ED Antimicrobial Stewardship Positive Culture Follow Up   Patricia Olson is an 34 y.o. female who presented to [facility] on 02/08/2015 with a chief complaint of  Chief Complaint  Patient presents with  . Generalized Body Aches    Recent Results (from the past 720 hour(s))  Rapid strep screen     Status: None   Collection Time: 02/08/15 12:54 AM  Result Value Ref Range Status   Streptococcus, Group A Screen (Direct) NEGATIVE NEGATIVE Final    Comment: (NOTE) A Rapid Antigen test may result negative if the antigen level in the sample is below the detection level of this test. The FDA has not cleared this test as a stand-alone test therefore the rapid antigen negative result has reflexed to a Group A Strep culture.   Culture, Group A Strep     Status: Abnormal   Collection Time: 02/08/15 12:54 AM  Result Value Ref Range Status   Strep A Culture Positive (A)  Final    Comment: (NOTE) Penicillin and ampicillin are drugs of choice for treatment of beta-hemolytic streptococcal infections. Susceptibility testing of penicillins and other beta-lactam agents approved by the FDA for treatment of beta-hemolytic streptococcal infections need not be performed routinely because nonsusceptible isolates are extremely rare in any beta-hemolytic streptococcus and have not been reported for Streptococcus pyogenes (group A). (CLSI 2011) Performed At: [facility] [address] [place], [place] [id number] Mila Homer MD IF:[id number]      [x]  Patient discharged originally without antimicrobial agent and treatment is now indicated  New antibiotic prescription: amoxicillin 500mg  BID x 10 days  ED Provider: Teressa Lower, PA-C   [name] 02/11/2015, 9:03 AM Infectious Diseases Pharmacist Phone# [phone]

## 2015-02-12 ENCOUNTER — Telehealth (HOSPITAL_COMMUNITY): Payer: Self-pay

## 2015-02-13 ENCOUNTER — Telehealth (HOSPITAL_BASED_OUTPATIENT_CLINIC_OR_DEPARTMENT_OTHER): Payer: Self-pay | Admitting: Emergency Medicine

## 2015-03-03 ENCOUNTER — Telehealth (HOSPITAL_COMMUNITY): Payer: Self-pay

## 2015-03-03 NOTE — ED Notes (Signed)
Unable to contact pt by mail or telephone. Unable to communicate lab results or treatment changes. 

## 2015-07-10 ENCOUNTER — Other Ambulatory Visit: Payer: Self-pay | Admitting: Obstetrics & Gynecology

## 2016-02-15 ENCOUNTER — Emergency Department (HOSPITAL_COMMUNITY): Payer: Medicaid Other

## 2016-02-15 ENCOUNTER — Encounter (HOSPITAL_COMMUNITY): Payer: Self-pay | Admitting: Emergency Medicine

## 2016-02-15 ENCOUNTER — Other Ambulatory Visit: Payer: Self-pay

## 2016-02-15 ENCOUNTER — Emergency Department (HOSPITAL_COMMUNITY)
Admission: EM | Admit: 2016-02-15 | Discharge: 2016-02-15 | Disposition: A | Discharge status: Home / Self Care | Payer: Medicaid Other | Attending: Emergency Medicine | Admitting: Emergency Medicine

## 2016-02-15 DIAGNOSIS — F17210 Nicotine dependence, cigarettes, uncomplicated: Secondary | ICD-10-CM | POA: Insufficient documentation

## 2016-02-15 DIAGNOSIS — R079 Chest pain, unspecified: Secondary | ICD-10-CM | POA: Insufficient documentation

## 2016-02-15 DIAGNOSIS — Z5321 Procedure and treatment not carried out due to patient leaving prior to being seen by health care provider: Secondary | ICD-10-CM

## 2016-02-15 DIAGNOSIS — R05 Cough: Secondary | ICD-10-CM | POA: Diagnosis not present

## 2016-02-15 DIAGNOSIS — F329 Major depressive disorder, single episode, unspecified: Secondary | ICD-10-CM | POA: Insufficient documentation

## 2016-02-15 LAB — BASIC METABOLIC PANEL
Anion gap: 8 (ref 5–15)
BUN: 13 mg/dL (ref 6–20)
CHLORIDE: 110 mmol/L (ref 101–111)
CO2: 20 mmol/L — AB (ref 22–32)
CREATININE: 0.71 mg/dL (ref 0.44–1.00)
Calcium: 9.3 mg/dL (ref 8.9–10.3)
GFR calc Af Amer: >60 mL/min (ref >60)
GFR calc non Af Amer: >60 mL/min (ref >60)
Glucose, Bld: 105 mg/dL — ABNORMAL HIGH (ref 65–99)
POTASSIUM: 3.2 mmol/L — AB (ref 3.5–5.1)
Sodium: 138 mmol/L (ref 135–145)

## 2016-02-15 LAB — CBC
HEMATOCRIT: 36.8 % (ref 36.0–46.0)
Hemoglobin: 12 g/dL (ref 12.0–15.0)
MCH: 28.3 pg (ref 26.0–34.0)
MCHC: 32.6 g/dL (ref 30.0–36.0)
MCV: 86.8 fL (ref 78.0–100.0)
PLATELETS: 176 10*3/uL (ref 150–400)
RBC: 4.24 MIL/uL (ref 3.87–5.11)
RDW: 12.6 % (ref 11.5–15.5)
WBC: 8.3 10*3/uL (ref 4.0–10.5)

## 2016-02-15 LAB — I-STAT TROPONIN, ED: Troponin i, poc: 0 ng/mL (ref 0.00–0.08)

## 2016-02-15 NOTE — ED Notes (Signed)
Left AMA.  Refused vitals and to sign AMA form.

## 2016-02-15 NOTE — ED Notes (Signed)
Walked out at this time.  States I'm not waiting.  Refused to sign AMA.  Physician made aware.

## 2016-02-15 NOTE — ED Provider Notes (Signed)
Patient left without being seen  1. Patient left without being seen       Melene Planan Marvelle Caudill, DO 02/15/16 0413

## 2016-02-15 NOTE — ED Notes (Signed)
Patient arrives with complaint of central chest pain secondary to cough. States her 36yo child has been sick and is currently on Abx for sore throat.

## 2016-04-13 ENCOUNTER — Ambulatory Visit
Admission: RE | Admit: 2016-04-13 | Discharge: 2016-04-13 | Disposition: A | Discharge status: Home / Self Care | Payer: Medicaid Other | Source: Ambulatory Visit | Attending: Family Medicine | Admitting: Family Medicine

## 2016-04-13 ENCOUNTER — Other Ambulatory Visit: Payer: Self-pay | Admitting: Family Medicine

## 2016-04-13 DIAGNOSIS — R221 Localized swelling, mass and lump, neck: Secondary | ICD-10-CM

## 2016-06-17 ENCOUNTER — Ambulatory Visit
Admission: RE | Admit: 2016-06-17 | Discharge: 2016-06-17 | Disposition: A | Discharge status: Home / Self Care | Payer: Medicaid Other | Source: Ambulatory Visit | Attending: Family Medicine | Admitting: Family Medicine

## 2016-06-17 ENCOUNTER — Other Ambulatory Visit: Payer: Self-pay | Admitting: Family Medicine

## 2016-06-17 DIAGNOSIS — J40 Bronchitis, not specified as acute or chronic: Secondary | ICD-10-CM

## 2017-01-21 ENCOUNTER — Other Ambulatory Visit: Payer: Self-pay | Admitting: Family Medicine

## 2017-01-21 DIAGNOSIS — N6019 Diffuse cystic mastopathy of unspecified breast: Secondary | ICD-10-CM

## 2017-01-26 ENCOUNTER — Other Ambulatory Visit: Payer: Self-pay | Admitting: Family Medicine

## 2017-01-26 DIAGNOSIS — N6019 Diffuse cystic mastopathy of unspecified breast: Secondary | ICD-10-CM

## 2017-05-23 ENCOUNTER — Other Ambulatory Visit: Payer: Self-pay | Admitting: Family Medicine

## 2017-05-23 DIAGNOSIS — N6019 Diffuse cystic mastopathy of unspecified breast: Secondary | ICD-10-CM

## 2017-05-31 ENCOUNTER — Ambulatory Visit
Admission: RE | Admit: 2017-05-31 | Discharge: 2017-05-31 | Disposition: A | Discharge status: Home / Self Care | Payer: Self-pay | Source: Ambulatory Visit | Attending: Family Medicine | Admitting: Family Medicine

## 2017-05-31 DIAGNOSIS — N6019 Diffuse cystic mastopathy of unspecified breast: Secondary | ICD-10-CM

## 2017-06-16 ENCOUNTER — Other Ambulatory Visit: Payer: Self-pay | Admitting: Nurse Practitioner

## 2017-06-16 ENCOUNTER — Ambulatory Visit
Admission: RE | Admit: 2017-06-16 | Discharge: 2017-06-16 | Disposition: A | Discharge status: Home / Self Care | Payer: Medicaid Other | Source: Ambulatory Visit | Attending: Nurse Practitioner | Admitting: Nurse Practitioner

## 2017-06-16 DIAGNOSIS — M79672 Pain in left foot: Secondary | ICD-10-CM

## 2017-08-26 ENCOUNTER — Other Ambulatory Visit: Payer: Self-pay

## 2017-08-26 ENCOUNTER — Encounter (HOSPITAL_BASED_OUTPATIENT_CLINIC_OR_DEPARTMENT_OTHER): Payer: Self-pay | Admitting: *Deleted

## 2017-08-26 ENCOUNTER — Emergency Department (HOSPITAL_BASED_OUTPATIENT_CLINIC_OR_DEPARTMENT_OTHER)
Admission: EM | Admit: 2017-08-26 | Discharge: 2017-08-26 | Disposition: A | Discharge status: Home / Self Care | Payer: Medicaid Other | Attending: Emergency Medicine | Admitting: Emergency Medicine

## 2017-08-26 DIAGNOSIS — Z79899 Other long term (current) drug therapy: Secondary | ICD-10-CM | POA: Diagnosis not present

## 2017-08-26 DIAGNOSIS — F17210 Nicotine dependence, cigarettes, uncomplicated: Secondary | ICD-10-CM | POA: Diagnosis not present

## 2017-08-26 DIAGNOSIS — R509 Fever, unspecified: Secondary | ICD-10-CM | POA: Diagnosis present

## 2017-08-26 DIAGNOSIS — K529 Noninfective gastroenteritis and colitis, unspecified: Secondary | ICD-10-CM | POA: Diagnosis not present

## 2017-08-26 LAB — CBC WITH DIFFERENTIAL/PLATELET
BASOS ABS: 0 10*3/uL (ref 0.0–0.1)
Basophils Relative: 0 %
EOS ABS: 0 10*3/uL (ref 0.0–0.7)
Eosinophils Relative: 1 %
HEMATOCRIT: 38.6 % (ref 36.0–46.0)
Hemoglobin: 12.6 g/dL (ref 12.0–15.0)
Lymphocytes Relative: 23 %
Lymphs Abs: 0.9 10*3/uL (ref 0.7–4.0)
MCH: 29.5 pg (ref 26.0–34.0)
MCHC: 32.6 g/dL (ref 30.0–36.0)
MCV: 90.4 fL (ref 78.0–100.0)
MONO ABS: 0.3 10*3/uL (ref 0.1–1.0)
MONOS PCT: 8 %
NEUTROS ABS: 2.7 10*3/uL (ref 1.7–7.7)
Neutrophils Relative %: 68 %
Platelets: 147 10*3/uL — ABNORMAL LOW (ref 150–400)
RBC: 4.27 MIL/uL (ref 3.87–5.11)
RDW: 12.2 % (ref 11.5–15.5)
WBC: 4 10*3/uL (ref 4.0–10.5)

## 2017-08-26 LAB — BASIC METABOLIC PANEL
Anion gap: 7 (ref 5–15)
BUN: 16 mg/dL (ref 6–20)
CALCIUM: 8.1 mg/dL — AB (ref 8.9–10.3)
CO2: 22 mmol/L (ref 22–32)
Chloride: 111 mmol/L (ref 101–111)
Creatinine, Ser: 0.58 mg/dL (ref 0.44–1.00)
Glucose, Bld: 107 mg/dL — ABNORMAL HIGH (ref 65–99)
Potassium: 3.1 mmol/L — ABNORMAL LOW (ref 3.5–5.1)
Sodium: 140 mmol/L (ref 135–145)

## 2017-08-26 LAB — URINALYSIS, MICROSCOPIC (REFLEX)

## 2017-08-26 LAB — URINALYSIS, ROUTINE W REFLEX MICROSCOPIC
Bilirubin Urine: NEGATIVE
GLUCOSE, UA: NEGATIVE mg/dL
Hgb urine dipstick: NEGATIVE
KETONES UR: NEGATIVE mg/dL
LEUKOCYTES UA: NEGATIVE
Nitrite: NEGATIVE
PROTEIN: 30 mg/dL — AB
Specific Gravity, Urine: >1.03 — ABNORMAL HIGH (ref 1.005–1.030)
pH: 5.5 (ref 5.0–8.0)

## 2017-08-26 LAB — HEPATIC FUNCTION PANEL
ALT: 10 U/L — AB (ref 14–54)
AST: 14 U/L — ABNORMAL LOW (ref 15–41)
Albumin: 3.7 g/dL (ref 3.5–5.0)
Alkaline Phosphatase: 46 U/L (ref 38–126)
BILIRUBIN DIRECT: 0.1 mg/dL (ref 0.1–0.5)
BILIRUBIN INDIRECT: 0.5 mg/dL (ref 0.3–0.9)
TOTAL PROTEIN: 6.7 g/dL (ref 6.5–8.1)
Total Bilirubin: 0.6 mg/dL (ref 0.3–1.2)

## 2017-08-26 LAB — LIPASE, BLOOD: LIPASE: 27 U/L (ref 11–51)

## 2017-08-26 MED ORDER — ONDANSETRON 8 MG PO TBDP: ORAL_TABLET | ORAL | 0 refills | Status: DC

## 2017-08-26 MED ORDER — SODIUM CHLORIDE 0.9 % IV BOLUS (SEPSIS)
1000.0000 mL | Freq: Once | INTRAVENOUS | Status: AC
  Administered 2017-08-26: 1000 mL via INTRAVENOUS

## 2017-08-26 MED ORDER — ONDANSETRON HCL 4 MG/2ML IJ SOLN
4.0000 mg | Freq: Once | INTRAMUSCULAR | Status: AC | PRN
  Administered 2017-08-26: 4 mg via INTRAVENOUS
  Filled 2017-08-26: qty 2
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
## 2017-08-26 NOTE — ED Triage Notes (Signed)
Abdominal pain, diarrhea, chills and fever since 4am.

## 2017-08-26 NOTE — Discharge Instructions (Signed)
Zofran as prescribed as needed for nausea.  Clear liquid diet for the next 12 hours, then slowly advance to normal.  Return to the emergency department if you develop severe abdominal pain, bloody stools, high fevers, or other new and concerning symptoms.

## 2017-09-07 ENCOUNTER — Emergency Department (HOSPITAL_BASED_OUTPATIENT_CLINIC_OR_DEPARTMENT_OTHER): Admission: EM | Admit: 2017-09-07 | Payer: Medicaid Other | Source: Home / Self Care

## 2017-12-27 ENCOUNTER — Other Ambulatory Visit: Payer: Self-pay | Admitting: Family Medicine

## 2017-12-27 DIAGNOSIS — R112 Nausea with vomiting, unspecified: Secondary | ICD-10-CM

## 2017-12-27 DIAGNOSIS — R319 Hematuria, unspecified: Secondary | ICD-10-CM

## 2017-12-27 DIAGNOSIS — R109 Unspecified abdominal pain: Secondary | ICD-10-CM

## 2017-12-28 ENCOUNTER — Ambulatory Visit
Admission: RE | Admit: 2017-12-28 | Discharge: 2017-12-28 | Disposition: A | Discharge status: Home / Self Care | Payer: Medicaid Other | Source: Ambulatory Visit | Attending: Family Medicine | Admitting: Family Medicine

## 2017-12-28 DIAGNOSIS — R112 Nausea with vomiting, unspecified: Secondary | ICD-10-CM

## 2017-12-28 DIAGNOSIS — R319 Hematuria, unspecified: Secondary | ICD-10-CM

## 2017-12-28 DIAGNOSIS — R109 Unspecified abdominal pain: Secondary | ICD-10-CM

## 2017-12-28 MED ORDER — IOPAMIDOL (ISOVUE-300) INJECTION 61%
100.0000 mL | Freq: Once | INTRAVENOUS | Status: AC | PRN
  Administered 2017-12-28: 100 mL via INTRAVENOUS

## 2018-01-09 ENCOUNTER — Other Ambulatory Visit: Payer: Self-pay | Admitting: Urology

## 2018-01-19 ENCOUNTER — Encounter (HOSPITAL_COMMUNITY): Payer: Self-pay

## 2018-01-20 ENCOUNTER — Encounter (HOSPITAL_COMMUNITY): Payer: Self-pay

## 2018-01-20 ENCOUNTER — Other Ambulatory Visit: Payer: Self-pay

## 2018-01-20 ENCOUNTER — Encounter (HOSPITAL_COMMUNITY)
Admission: RE | Admit: 2018-01-20 | Discharge: 2018-01-20 | Disposition: A | Discharge status: Home / Self Care | Payer: Medicaid Other | Source: Ambulatory Visit | Attending: Urology | Admitting: Urology

## 2018-01-20 DIAGNOSIS — R102 Pelvic and perineal pain: Secondary | ICD-10-CM | POA: Insufficient documentation

## 2018-01-20 DIAGNOSIS — R319 Hematuria, unspecified: Secondary | ICD-10-CM | POA: Insufficient documentation

## 2018-01-20 DIAGNOSIS — N3592 Unspecified urethral stricture, female: Secondary | ICD-10-CM | POA: Diagnosis not present

## 2018-01-20 DIAGNOSIS — Z01812 Encounter for preprocedural laboratory examination: Secondary | ICD-10-CM | POA: Diagnosis present

## 2018-01-20 HISTORY — DX: Nontoxic goiter, unspecified: E04.9

## 2018-01-20 HISTORY — DX: Dizziness and giddiness: R42

## 2018-01-20 LAB — CBC
HEMATOCRIT: 39.3 % (ref 36.0–46.0)
Hemoglobin: 12.8 g/dL (ref 12.0–15.0)
MCH: 29.9 pg (ref 26.0–34.0)
MCHC: 32.6 g/dL (ref 30.0–36.0)
MCV: 91.8 fL (ref 78.0–100.0)
PLATELETS: 195 10*3/uL (ref 150–400)
RBC: 4.28 MIL/uL (ref 3.87–5.11)
RDW: 12.2 % (ref 11.5–15.5)
WBC: 5.7 10*3/uL (ref 4.0–10.5)

## 2018-01-20 NOTE — Patient Instructions (Signed)
Your procedure is scheduled on: January 31, 2018   Surgery Time: 9:30AM-10:15Am   Report to [facility] Main  Entrance    Report to admitting at 7:30 AM   Call this number if you have problems the morning of surgery [phone]   Do not eat food or drink liquids :After Midnight.   Do NOT smoke after Midnight   Take these medicines the morning of surgery with A SIP OF WATER: None                               You may not have any metal on your body including hair pins, jewelry, and body piercings             Do not wear make-up, lotions, powders, perfumes/cologne, or deodorant             Do not wear nail polish.  Do not shave  48 hours prior to surgery.                Do not bring valuables to the hospital. [facility] IS NOT             RESPONSIBLE   FOR VALUABLES.   Contacts, dentures or bridgework may not be worn into surgery.    Patients discharged the day of surgery will not be allowed to drive home.    Special Instructions: Bring a copy of your healthcare power of attorney and living will documents         the day of surgery if you haven't scanned them in before.              Please read over the following fact sheets you were given:  [facility] - Preparing for Surgery Before surgery, you can play an important role.  Because skin is not sterile, your skin needs to be as free of germs as possible.  You can reduce the number of germs on your skin by washing with CHG (chlorahexidine gluconate) soap before surgery.  CHG is an antiseptic cleaner which kills germs and bonds with the skin to continue killing germs even after washing. Please DO NOT use if you have an allergy to CHG or antibacterial soaps.  If your skin becomes reddened/irritated stop using the CHG and inform your nurse when you arrive at Short Stay. Do not shave (including legs and underarms) for at least 48 hours prior to the first CHG shower.  You may shave your face/neck.  Please follow these  instructions carefully:  1.  Shower with CHG Soap the night before surgery and the  morning of surgery.  2.  If you choose to wash your hair, wash your hair first as usual with your normal  shampoo.  3.  After you shampoo, rinse your hair and body thoroughly to remove the shampoo.                             4.  Use CHG as you would any other liquid soap.  You can apply chg directly to the skin and wash.  Gently with a scrungie or clean washcloth.  5.  Apply the CHG Soap to your body ONLY FROM THE NECK DOWN.   Do   not use on face/ open  Wound or open sores. Avoid contact with eyes, ears mouth and   genitals (private parts).                       Wash face,  Genitals (private parts) with your normal soap.             6.  Wash thoroughly, paying special attention to the area where your    surgery  will be performed.  7.  Thoroughly rinse your body with warm water from the neck down.  8.  DO NOT shower/wash with your normal soap after using and rinsing off the CHG Soap.                9.  Pat yourself dry with a clean towel.            10.  Wear clean pajamas.            11.  Place clean sheets on your bed the night of your first shower and do not  sleep with pets. Day of Surgery : Do not apply any lotions/deodorants the morning of surgery.  Please wear clean clothes to the hospital/surgery center.  FAILURE TO FOLLOW THESE INSTRUCTIONS MAY RESULT IN THE CANCELLATION OF YOUR SURGERY  PATIENT SIGNATURE_________________________________  NURSE SIGNATURE__________________________________  ________________________________________________________________________

## 2018-01-27 NOTE — H&P (Signed)
I was consulted by the above provider to assess the patient's hematuria. Two and half months ago she saw blood 3 times. She was having a lot of bowel and pelvic complaints and saw gastroenterologist in all this is normalized. I think Larita Fife point in time the thought she might even have interstitial cystitis.   She has had likely a sling 3-4 years ago it was initially dry. No she leaks with coughing sneezing with a full bladder but not with bending and lifting. Sometimes she has urge incontinence. She denies bedwetting. She is not wearing a pad   She voids every 1 hour cannot hold it for 2 hours. She gets up twice a night. She will hesitate for 2 minutes and then has a good flow   She has chronic left low back burning for years and perhaps on both sides.   On pelvic examination the patient had a large grade 1 or small grade 2 cystocele that was asymptomatic. She had a lot of mucosal folds underneath the urethra and I cannot feel or see any sling extrusion. She had no rectocele. She has some mild meatal stenosis   I could not insert the cystoscope due to the mild meatal stenosis. She was little bit nervous but very compliant. I could insert a well lubricated 14 French catheter only.   patient currently has mild mixed incontinence. She has hourly frequency. She has mild nocturia. She has a distant history of hematuria and likely has had a sling. Workup described. Urine sent for culture. No microscopic hematuria today. The patient will receive a CT scan. the role of cystoscopy under anesthesia vs. watchful waiting discussed. she does have a smoking history. I think blood in the urine should be sorted out before any potential goals for mild mixed incontinence. in my opinion it would not have been appropriate today to try to dilate her urethra post sling and her being a bit tender and nervous. She was tender at the meatus with gentle pressure from the telescope   I will see her with CT scan. I believe we  likely may put her to sleep in the future with the risk factors noted above. She is bothered by her incontinence and I will discussed pathophysiology and the role of urodynamics next time. having thought about it more if I was gentle in she consented to it I could do a gentle dilation in the office to avoid anesthesia. I will discuss this as well. I will send a note the above provided her incontinence is bothering her a lot   Today  The patient had a CT scan with contrast ordered by another doctor. It was normal. We will scan the note.  Last urine culture negative  the patient no longer has gross hematuria but based upon 2 risk factors would like to have cystoscopy with a mild urethral dilation under anesthesia and a biopsy if needed.  She says her main complaint is nocturia x4. Sometimes she has urge incontinence not wearing a pad. She tends to jump around a lot from 1 complaint to another.  She also had nonspecific discomfort when she holds it too long   I recommended the following. She did not want gentle dilation in the office. Will do a cystoscopy and hydrodistention gently looking for any evidence of interstitial cystitis. I will look for eroded mesh in any evidence of cancer. Biopsy with fulguration discussed with potential sequelae including bladder perforation and management. I will not treat her mild incontinence during  the day currently.   The pathophysiology of nighttime frequency discussed. Recognizing she has Medicaid I gave her 3 weeks of the desmopressin low-dose melt. Pros cons risks and black box warning discussed. Usual protocol discussed. I will ask her how this is working when she has her procedure       ALLERGIES: None   MEDICATIONS: Abilify 5 mg tablet  Adderall 7.5 mg tablet  Probiotic     GU PSH: No GU PSH      PSH Notes: Bladder Surgery, Hysterectomy, Oral Surgery   NON-GU PSH: No Non-GU PSH    GU PMH: Gross hematuria - 12/21/2017 Mixed incontinence -  12/21/2017 Nocturia - 12/21/2017 Urinary Frequency - 12/21/2017 Dysuria - 05/04/2016    NON-GU PMH: Anxiety    FAMILY HISTORY: 2 daughters - Daughter 2 sons - Son   SOCIAL HISTORY: Marital Status: Married Current Smoking Status: Patient smokes. Has smoked since 11/29/1995. Smokes 1 pack per day.   Tobacco Use Assessment Completed: Used Tobacco in last 30 days? Has never drank.  Drinks 3 caffeinated drinks per day.    REVIEW OF SYSTEMS:    GU Review Female:   Patient reports hard to postpone urination, trouble starting your stream, leakage of urine, have to strain to urinate, and frequent urination. Patient denies stream starts and stops, get up at night to urinate, burning /pain with urination, and being pregnant.  Gastrointestinal (Upper):   Patient denies nausea, vomiting, and indigestion/ heartburn.  Gastrointestinal (Lower):   Patient denies diarrhea and constipation.  Constitutional:   Patient denies fever, night sweats, weight loss, and fatigue.  Skin:   Patient denies skin rash/ lesion and itching.  Eyes:   Patient denies blurred vision and double vision.  Ears/ Nose/ Throat:   Patient denies sore throat and sinus problems.  Hematologic/Lymphatic:   Patient denies swollen glands and easy bruising.  Cardiovascular:   Patient denies leg swelling and chest pains.  Respiratory:   Patient denies cough and shortness of breath.  Endocrine:   Patient denies excessive thirst.  Musculoskeletal:   Patient denies back pain and joint pain.  Neurological:   Patient denies headaches and dizziness.  Psychologic:   Patient denies depression and anxiety.   VITAL SIGNS: None   PAST DATA REVIEWED:  Source Of History:  Patient   PROCEDURES:          Urinalysis Dipstick Dipstick Cont'd Micro  Color: Yellow Bilirubin: Neg WBC/hpf: 6 - 10/hpf  Appearance: Cloudy Ketones: Neg RBC/hpf: 0 - 2/hpf  Specific Gravity: 1.025 Blood: 1+ Bacteria: Few (10-25/hpf)  pH: 5.5 Protein: Trace Cystals: NS  (Not Seen)  Glucose: Neg Urobilinogen: 0.2 Casts: NS (Not Seen)    Nitrites: Neg Trichomonas: Not Present    Leukocyte Esterase: 2+ Mucous: Present      Epithelial Cells: 10 - 20/hpf      Yeast: NS (Not Seen)      Sperm: Not Present    ASSESSMENT:      ICD-10 Details  1 GU:   Dysuria - R30.0   2   Mixed incontinence - N39.46               Notes:   The patient understands that they she/he could have interstitial cystitis. We talked about the role of cystoscopy/hydrodistension and instillation in detail. Pros, cons, general surgical and anesthetic risks, and other options including watchful waiting were discussed. Risks were described but not limited to pain, infection, and bleeding. The risk of bladder perforation and management  were discussed. The patient understands that it is primarily a diagnostic procedure.     PLAN:            Medications New Meds: Nocdurna 27.7 mcg tablet,disintegrating 1 tablet Sublingual 1 hour before bedtime   #30  11 Refill(s)            Orders Labs BMP, Urine Culture          Schedule Labs: 1 Week - Sodium  Return Visit/Planned Activity: 1 Week - Sodium  Return Visit/Planned Activity: Return PRN - Office Visit             Note: we will call the patient   After a thorough review of the management options for the patient's condition the patient  elected to proceed with surgical therapy as noted above. We have discussed the potential benefits and risks of the procedure, side effects of the proposed treatment, the likelihood of the patient achieving the goals of the procedure, and any potential problems that might occur during the procedure or recuperation. Informed consent has been obtained.

## 2018-01-31 ENCOUNTER — Ambulatory Visit (HOSPITAL_COMMUNITY): Payer: Medicaid Other | Admitting: Anesthesiology

## 2018-01-31 ENCOUNTER — Ambulatory Visit (HOSPITAL_COMMUNITY)
Admission: RE | Admit: 2018-01-31 | Discharge: 2018-01-31 | Disposition: A | Discharge status: Home / Self Care | Payer: Medicaid Other | Source: Ambulatory Visit | Attending: Urology | Admitting: Urology

## 2018-01-31 ENCOUNTER — Encounter (HOSPITAL_COMMUNITY): Payer: Self-pay | Admitting: *Deleted

## 2018-01-31 ENCOUNTER — Encounter (HOSPITAL_COMMUNITY)
Admission: RE | Disposition: A | Discharge status: Home / Self Care | Payer: Self-pay | Source: Ambulatory Visit | Attending: Urology

## 2018-01-31 DIAGNOSIS — N3592 Unspecified urethral stricture, female: Secondary | ICD-10-CM | POA: Insufficient documentation

## 2018-01-31 DIAGNOSIS — N3946 Mixed incontinence: Secondary | ICD-10-CM | POA: Diagnosis not present

## 2018-01-31 DIAGNOSIS — F17210 Nicotine dependence, cigarettes, uncomplicated: Secondary | ICD-10-CM | POA: Insufficient documentation

## 2018-01-31 DIAGNOSIS — Z79899 Other long term (current) drug therapy: Secondary | ICD-10-CM | POA: Insufficient documentation

## 2018-01-31 DIAGNOSIS — R351 Nocturia: Secondary | ICD-10-CM | POA: Insufficient documentation

## 2018-01-31 DIAGNOSIS — N3011 Interstitial cystitis (chronic) with hematuria: Secondary | ICD-10-CM | POA: Diagnosis not present

## 2018-01-31 DIAGNOSIS — R319 Hematuria, unspecified: Secondary | ICD-10-CM | POA: Diagnosis present

## 2018-01-31 DIAGNOSIS — R300 Dysuria: Secondary | ICD-10-CM | POA: Insufficient documentation

## 2018-01-31 HISTORY — PX: CYSTOSCOPY WITH HYDRODISTENSION AND BIOPSY: SHX5127

## 2018-01-31 SURGERY — CYSTOSCOPY, WITH BLADDER HYDRODISTENSION AND BIOPSY
Anesthesia: General

## 2018-01-31 MED ORDER — FENTANYL CITRATE (PF) 100 MCG/2ML IJ SOLN
INTRAMUSCULAR | Status: AC
  Filled 2018-01-31: qty 2

## 2018-01-31 MED ORDER — DEXAMETHASONE SODIUM PHOSPHATE 4 MG/ML IJ SOLN
INTRAMUSCULAR | Status: DC | PRN
Start: 2018-01-31 — End: 2018-01-31
  Administered 2018-01-31: 10 mg via INTRAVENOUS

## 2018-01-31 MED ORDER — MEPERIDINE HCL 50 MG/ML IJ SOLN: 6.2500 mg | INTRAMUSCULAR | Status: DC | PRN

## 2018-01-31 MED ORDER — FENTANYL CITRATE (PF) 100 MCG/2ML IJ SOLN: 25.0000 ug | INTRAMUSCULAR | Status: DC | PRN

## 2018-01-31 MED ORDER — LIDOCAINE HCL (CARDIAC) PF 100 MG/5ML IV SOSY
PREFILLED_SYRINGE | INTRAVENOUS | Status: DC | PRN
  Administered 2018-01-31: 50 mg via INTRAVENOUS

## 2018-01-31 MED ORDER — HYDROCODONE-ACETAMINOPHEN 5-325 MG PO TABS: 1.0000 | ORAL_TABLET | ORAL | 0 refills | Status: DC | PRN

## 2018-01-31 MED ORDER — ONDANSETRON HCL 4 MG/2ML IJ SOLN
INTRAMUSCULAR | Status: DC | PRN
  Administered 2018-01-31: 4 mg via INTRAVENOUS

## 2018-01-31 MED ORDER — MIDAZOLAM HCL 5 MG/5ML IJ SOLN
INTRAMUSCULAR | Status: DC | PRN
  Administered 2018-01-31: 2 mg via INTRAVENOUS
  Administered 2018-01-31: 10 mg via INTRAVENOUS

## 2018-01-31 MED ORDER — PROPOFOL 10 MG/ML IV BOLUS
INTRAVENOUS | Status: AC
  Filled 2018-01-31: qty 20

## 2018-01-31 MED ORDER — BUPIVACAINE HCL (PF) 0.25 % IJ SOLN
INTRAMUSCULAR | Status: DC | PRN
  Administered 2018-01-31: 25 mL

## 2018-01-31 MED ORDER — BUPIVACAINE HCL (PF) 0.25 % IJ SOLN
INTRAMUSCULAR | Status: AC
  Filled 2018-01-31: qty 30

## 2018-01-31 MED ORDER — FENTANYL CITRATE (PF) 100 MCG/2ML IJ SOLN
INTRAMUSCULAR | Status: DC | PRN
  Administered 2018-01-31 (×2): 25 ug via INTRAVENOUS
  Administered 2018-01-31: 50 ug via INTRAVENOUS

## 2018-01-31 MED ORDER — MIDAZOLAM HCL 2 MG/2ML IJ SOLN
INTRAMUSCULAR | Status: AC
  Filled 2018-01-31: qty 2

## 2018-01-31 MED ORDER — LACTATED RINGERS IV SOLN
INTRAVENOUS | Status: DC
  Administered 2018-01-31: 10:00:00 via INTRAVENOUS

## 2018-01-31 MED ORDER — METOCLOPRAMIDE HCL 5 MG/ML IJ SOLN: 10.0000 mg | Freq: Once | INTRAMUSCULAR | Status: DC | PRN

## 2018-01-31 MED ORDER — PROPOFOL 10 MG/ML IV BOLUS
INTRAVENOUS | Status: DC | PRN
  Administered 2018-01-31: 100 mg via INTRAVENOUS

## 2018-01-31 MED ORDER — LACTATED RINGERS IV SOLN: INTRAVENOUS | Status: DC

## 2018-01-31 MED ORDER — CIPROFLOXACIN IN D5W 400 MG/200ML IV SOLN
400.0000 mg | INTRAVENOUS | Status: AC
  Administered 2018-01-31: 400 mg via INTRAVENOUS
  Filled 2018-01-31: qty 200

## 2018-01-31 MED ORDER — STERILE WATER FOR IRRIGATION IR SOLN
Status: DC | PRN
  Administered 2018-01-31: 3000 mL via INTRAVESICAL

## 2018-01-31 SURGICAL SUPPLY — 14 items
BAG URO CATCHER STRL LF (MISCELLANEOUS) ×2 IMPLANT
BALLN NEPHROSTOMY (BALLOONS)
BALLOON NEPHROSTOMY (BALLOONS) ×1 IMPLANT
CATH ROBINSON RED A/P 14FR (CATHETERS) ×1 IMPLANT
CLOTH BEACON ORANGE TIMEOUT ST (SAFETY) ×2 IMPLANT
COVER FOOTSWITCH UNIV (MISCELLANEOUS) IMPLANT
COVER SURGICAL LIGHT HANDLE (MISCELLANEOUS) ×2 IMPLANT
GLOVE BIOGEL M STRL SZ7.5 (GLOVE) ×2 IMPLANT
GOWN STRL REUS W/TWL XL LVL3 (GOWN DISPOSABLE) ×2 IMPLANT
GUIDEWIRE STR DUAL SENSOR (WIRE) ×1 IMPLANT
HOLDER FOLEY CATH W/STRAP (MISCELLANEOUS) ×1 IMPLANT
PACK CYSTO (CUSTOM PROCEDURE TRAY) ×2 IMPLANT
SYR 30ML LL (SYRINGE) ×1 IMPLANT
TUBING CONNECTING 10 (TUBING) ×1 IMPLANT

## 2018-01-31 NOTE — Transfer of Care (Signed)
Immediate Anesthesia Transfer of Care Note  Patient: Patricia BeardsCori F Croswell  Procedure(s) Performed: CYSTOSCOPY, URETHRAL DILATION, HYDRODISTENSION INSTILLATION (N/A )  Patient Location: PACU  Anesthesia Type:General  Level of Consciousness: awake, drowsy and responds to stimulation  Airway & Oxygen Therapy: Patient Spontanous Breathing and Patient connected to face mask oxygen  Post-op Assessment: Report given to RN and Post -op Vital signs reviewed and stable  Post vital signs: Reviewed and stable  Last Vitals:  Vitals Value Taken Time  BP 100/72 01/31/2018 10:54 AM  Temp    Pulse 63 01/31/2018 10:56 AM  Resp 16 01/31/2018 10:56 AM  SpO2 100 % 01/31/2018 10:56 AM  Vitals shown include unvalidated device data.  Last Pain:  Vitals:   01/31/18 0814  TempSrc:   PainSc: 0-No pain         Complications: No apparent anesthesia complications

## 2018-01-31 NOTE — Anesthesia Procedure Notes (Signed)
Procedure Name: LMA Insertion Date/Time: 01/31/2018 10:14 AM Performed by: Ludwig LeanJones, Charene Mccallister C, CRNA Pre-anesthesia Checklist: Patient identified, Emergency Drugs available, Suction available and Patient being monitored Patient Re-evaluated:Patient Re-evaluated prior to induction Oxygen Delivery Method: Circle system utilized Preoxygenation: Pre-oxygenation with 100% oxygen Induction Type: IV induction Ventilation: Mask ventilation without difficulty LMA: LMA inserted LMA Size: 3.0 Number of attempts: 1 Placement Confirmation: positive ETCO2 and breath sounds checked- equal and bilateral Tube secured with: Tape Dental Injury: Teeth and Oropharynx as per pre-operative assessment

## 2018-01-31 NOTE — Interval H&P Note (Signed)
History and Physical Interval Note:  01/31/2018 9:41 AM  Patricia Olson  has presented today for surgery, with the diagnosis of pelvic pain hematuria urethral stenosis  The various methods of treatment have been discussed with the patient and family. After consideration of risks, benefits and other options for treatment, the patient has consented to  Procedure(s): CYSTOSCOPY POSSIBLE  BIOPSY FULGURATION HYDRODISTENSION INSTILLATION (N/A) CYSTOSCOPY WITH URETHRAL DILATATION (N/A) as a surgical intervention .  The patient's history has been reviewed, patient examined, no change in status, stable for surgery.  I have reviewed the patient's chart and labs.  Questions were answered to the patient's satisfaction.     Carter Kaman A

## 2018-01-31 NOTE — Anesthesia Postprocedure Evaluation (Signed)
Anesthesia Post Note  Patient: Patricia Olson  Procedure(s) Performed: CYSTOSCOPY, URETHRAL DILATION, HYDRODISTENSION INSTILLATION (N/A )     Patient location during evaluation: PACU Anesthesia Type: General Level of consciousness: awake and alert Pain management: pain level controlled Vital Signs Assessment: post-procedure vital signs reviewed and stable Respiratory status: spontaneous breathing, nonlabored ventilation, respiratory function stable and patient connected to nasal cannula oxygen Cardiovascular status: blood pressure returned to baseline and stable Postop Assessment: no apparent nausea or vomiting Anesthetic complications: no    Last Vitals:  Vitals:   01/31/18 1130 01/31/18 1145  BP: 101/82 108/83  Pulse: 60 65  Resp: 13 16  Temp: (!) 36.4 C 36.6 C  SpO2: 100% 100%    Last Pain:  Vitals:   01/31/18 1145  TempSrc:   PainSc: 0-No pain                 Phillips Groutarignan, Haylei Cobin

## 2018-01-31 NOTE — Op Note (Signed)
Preoperative diagnosis: Hematuria Postoperative diagnosis: Hematuria and interstitial cystitis and mild meatal stenosis Surgery: Cystoscopy and urethral dilation and hydrodistention and instillation Surgeon: Dr. Lorin PicketScott Mcdiarmid  The patient has the above diagnosis and consent of the above procedure.  She was prepped and draped in the usual fashion.  Extra care was taken with leg positioning to minimize the risk of compartment syndrome and neuropathy and deep vein thrombosis.  The medication for nocturia had not helped her.  On vaginal examination I could not feel or see any sling or mesh extrusion.  She had minimal or mild meatal stenosis.  The [address] [place] scope would not going easily.  I gently dilated from 14-20 [place] well lubricated female sounds atraumatically recognizing she has had a previous sling.  I cystoscoped the patient.  Bladder mucosa and trigone were normal.  There is no carcinoma.  There was no foreign body in the bladder or urethra.    She was hydrodistended to 700 m  Bladder was empty.  On reinspection she had diffuse glomerulations throughout the bladder especially at 5 and 7:00.  There is no ulcers.  There is no bladder injury.  Bladder is empty.  As a separate procedure red rubber catheter was inserted in the bladder and 25 cc of 0.25% Marcaine was instilled  Patient was taken to recovery.  Patient will be treated for interstitial cystitis

## 2018-01-31 NOTE — Discharge Instructions (Signed)
I have reviewed discharge instructions in detail with the patient. They will follow-up with me or their physician as scheduled. My nurse will also be calling the patients as per protocol.   

## 2018-01-31 NOTE — Anesthesia Preprocedure Evaluation (Signed)
Anesthesia Evaluation  Patient identified by MRN, date of birth, ID band Patient awake    Reviewed: Allergy & Precautions, NPO status , Patient's Chart, lab work & pertinent test results  Airway Mallampati: II  TM Distance: >3 FB Neck ROM: Full    Dental no notable dental hx.    Pulmonary Current Smoker,    Pulmonary exam normal breath sounds clear to auscultation       Cardiovascular negative cardio ROS Normal cardiovascular exam Rhythm:Regular Rate:Normal     Neuro/Psych negative neurological ROS  negative psych ROS   GI/Hepatic negative GI ROS, Neg liver ROS,   Endo/Other  negative endocrine ROS  Renal/GU negative Renal ROS  negative genitourinary   Musculoskeletal negative musculoskeletal ROS (+)   Abdominal   Peds negative pediatric ROS (+)  Hematology negative hematology ROS (+)   Anesthesia Other Findings   Reproductive/Obstetrics negative OB ROS                             Anesthesia Physical Anesthesia Plan  ASA: II  Anesthesia Plan: General   Post-op Pain Management:    Induction: Intravenous  PONV Risk Score and Plan: 2 and Ondansetron and Treatment may vary due to age or medical condition  Airway Management Planned: LMA and Video Laryngoscope Planned  Additional Equipment:   Intra-op Plan:   Post-operative Plan: Extubation in OR  Informed Consent: I have reviewed the patients History and Physical, chart, labs and discussed the procedure including the risks, benefits and alternatives for the proposed anesthesia with the patient or authorized representative who has indicated his/her understanding and acceptance.   Dental advisory given  Plan Discussed with: CRNA  Anesthesia Plan Comments:         Anesthesia Quick Evaluation

## 2018-02-01 ENCOUNTER — Encounter (HOSPITAL_COMMUNITY): Payer: Self-pay | Admitting: Urology

## 2018-07-06 ENCOUNTER — Other Ambulatory Visit: Payer: Self-pay

## 2018-07-06 ENCOUNTER — Encounter (HOSPITAL_BASED_OUTPATIENT_CLINIC_OR_DEPARTMENT_OTHER): Payer: Self-pay | Admitting: *Deleted

## 2018-07-06 ENCOUNTER — Emergency Department (HOSPITAL_BASED_OUTPATIENT_CLINIC_OR_DEPARTMENT_OTHER): Payer: Self-pay

## 2018-07-06 ENCOUNTER — Emergency Department (HOSPITAL_BASED_OUTPATIENT_CLINIC_OR_DEPARTMENT_OTHER)
Admission: EM | Admit: 2018-07-06 | Discharge: 2018-07-06 | Disposition: A | Discharge status: Home / Self Care | Payer: Self-pay | Attending: Emergency Medicine | Admitting: Emergency Medicine

## 2018-07-06 DIAGNOSIS — F439 Reaction to severe stress, unspecified: Secondary | ICD-10-CM | POA: Insufficient documentation

## 2018-07-06 DIAGNOSIS — Z79899 Other long term (current) drug therapy: Secondary | ICD-10-CM | POA: Insufficient documentation

## 2018-07-06 DIAGNOSIS — F17210 Nicotine dependence, cigarettes, uncomplicated: Secondary | ICD-10-CM | POA: Insufficient documentation

## 2018-07-06 DIAGNOSIS — R413 Other amnesia: Secondary | ICD-10-CM | POA: Insufficient documentation

## 2018-07-06 DIAGNOSIS — N390 Urinary tract infection, site not specified: Secondary | ICD-10-CM | POA: Insufficient documentation

## 2018-07-06 LAB — COMPREHENSIVE METABOLIC PANEL
ALT: 14 U/L (ref 0–44)
ANION GAP: 9 (ref 5–15)
AST: 14 U/L — ABNORMAL LOW (ref 15–41)
Albumin: 4.1 g/dL (ref 3.5–5.0)
Alkaline Phosphatase: 61 U/L (ref 38–126)
BUN: 16 mg/dL (ref 6–20)
CHLORIDE: 103 mmol/L (ref 98–111)
CO2: 26 mmol/L (ref 22–32)
Calcium: 8.9 mg/dL (ref 8.9–10.3)
Creatinine, Ser: 0.76 mg/dL (ref 0.44–1.00)
GFR calc non Af Amer: >60 mL/min (ref >60)
Glucose, Bld: 63 mg/dL — ABNORMAL LOW (ref 70–99)
POTASSIUM: 3.4 mmol/L — AB (ref 3.5–5.1)
SODIUM: 138 mmol/L (ref 135–145)
Total Bilirubin: 0.4 mg/dL (ref 0.3–1.2)
Total Protein: 7 g/dL (ref 6.5–8.1)

## 2018-07-06 LAB — CBC WITH DIFFERENTIAL/PLATELET
Abs Immature Granulocytes: 0.01 10*3/uL (ref 0.00–0.07)
BASOS PCT: 0 %
Basophils Absolute: 0 10*3/uL (ref 0.0–0.1)
EOS ABS: 0.1 10*3/uL (ref 0.0–0.5)
EOS PCT: 2 %
HCT: 42.9 % (ref 36.0–46.0)
Hemoglobin: 13.8 g/dL (ref 12.0–15.0)
Immature Granulocytes: 0 %
Lymphocytes Relative: 40 %
Lymphs Abs: 2.8 10*3/uL (ref 0.7–4.0)
MCH: 29.6 pg (ref 26.0–34.0)
MCHC: 32.2 g/dL (ref 30.0–36.0)
MCV: 91.9 fL (ref 80.0–100.0)
MONO ABS: 0.4 10*3/uL (ref 0.1–1.0)
Monocytes Relative: 6 %
Neutro Abs: 3.6 10*3/uL (ref 1.7–7.7)
Neutrophils Relative %: 52 %
PLATELETS: 199 10*3/uL (ref 150–400)
RBC: 4.67 MIL/uL (ref 3.87–5.11)
RDW: 12.1 % (ref 11.5–15.5)
WBC: 7 10*3/uL (ref 4.0–10.5)
nRBC: 0 % (ref 0.0–0.2)

## 2018-07-06 LAB — URINALYSIS, MICROSCOPIC (REFLEX)

## 2018-07-06 LAB — URINALYSIS, ROUTINE W REFLEX MICROSCOPIC
BILIRUBIN URINE: NEGATIVE
GLUCOSE, UA: NEGATIVE mg/dL
KETONES UR: NEGATIVE mg/dL
Nitrite: NEGATIVE
PH: 6 (ref 5.0–8.0)
Protein, ur: NEGATIVE mg/dL

## 2018-07-06 LAB — SALICYLATE LEVEL: Salicylate Lvl: <7 mg/dL (ref 2.8–30.0)

## 2018-07-06 LAB — RAPID URINE DRUG SCREEN, HOSP PERFORMED
AMPHETAMINES: POSITIVE — AB
Barbiturates: NOT DETECTED
Benzodiazepines: POSITIVE — AB
COCAINE: NOT DETECTED
Opiates: NOT DETECTED
TETRAHYDROCANNABINOL: NOT DETECTED

## 2018-07-06 LAB — ACETAMINOPHEN LEVEL: Acetaminophen (Tylenol), Serum: <10 ug/mL — ABNORMAL LOW (ref 10–30)

## 2018-07-06 LAB — ETHANOL

## 2018-07-06 MED ORDER — SODIUM CHLORIDE 0.9 % IV BOLUS: 1000.0000 mL | Freq: Once | INTRAVENOUS | Status: DC

## 2018-07-06 MED ORDER — CEPHALEXIN 500 MG PO CAPS: 500.0000 mg | ORAL_CAPSULE | Freq: Two times a day (BID) | ORAL | 0 refills | Status: AC

## 2018-07-06 NOTE — ED Triage Notes (Signed)
Husband states confusion , slurred speech and unsteady gait  x 2 days

## 2018-07-06 NOTE — ED Notes (Signed)
Pt assisted to BR for urine specimen. Pt cursing and wanting RN to remove IV. Pt informed the urine sample was the last test result we were waiting on and EDP would be back to reeval. Pt stated, "I am about to leave I hate hospitals". Pt hooked back on monitor and made comfortable.

## 2018-07-06 NOTE — ED Notes (Signed)
ED Provider at bedside. 

## 2018-07-06 NOTE — ED Notes (Signed)
Pt requested that this RN remove her IV and wanted to speak with provider as to why she needed to receive iv fluids. Dr Dalene Seltzer notified.
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
## 2018-09-13 ENCOUNTER — Encounter (HOSPITAL_COMMUNITY)
Admission: EM | Disposition: A | Discharge status: Home / Self Care | Payer: Self-pay | Source: Home / Self Care | Attending: Emergency Medicine

## 2018-09-13 ENCOUNTER — Emergency Department (HOSPITAL_BASED_OUTPATIENT_CLINIC_OR_DEPARTMENT_OTHER): Payer: Self-pay

## 2018-09-13 ENCOUNTER — Observation Stay (HOSPITAL_COMMUNITY): Payer: Self-pay | Admitting: Anesthesiology

## 2018-09-13 ENCOUNTER — Other Ambulatory Visit: Payer: Self-pay

## 2018-09-13 ENCOUNTER — Encounter (HOSPITAL_BASED_OUTPATIENT_CLINIC_OR_DEPARTMENT_OTHER): Payer: Self-pay | Admitting: *Deleted

## 2018-09-13 ENCOUNTER — Observation Stay (HOSPITAL_BASED_OUTPATIENT_CLINIC_OR_DEPARTMENT_OTHER)
Admission: EM | Admit: 2018-09-13 | Discharge: 2018-09-14 | Disposition: A | Discharge status: Home / Self Care | Payer: Self-pay | Attending: Surgery | Admitting: Surgery

## 2018-09-13 DIAGNOSIS — F329 Major depressive disorder, single episode, unspecified: Secondary | ICD-10-CM | POA: Insufficient documentation

## 2018-09-13 DIAGNOSIS — Z79899 Other long term (current) drug therapy: Secondary | ICD-10-CM | POA: Insufficient documentation

## 2018-09-13 DIAGNOSIS — G8929 Other chronic pain: Secondary | ICD-10-CM | POA: Insufficient documentation

## 2018-09-13 DIAGNOSIS — K3580 Unspecified acute appendicitis: Principal | ICD-10-CM | POA: Diagnosis present

## 2018-09-13 DIAGNOSIS — F17210 Nicotine dependence, cigarettes, uncomplicated: Secondary | ICD-10-CM | POA: Insufficient documentation

## 2018-09-13 DIAGNOSIS — F429 Obsessive-compulsive disorder, unspecified: Secondary | ICD-10-CM | POA: Insufficient documentation

## 2018-09-13 HISTORY — PX: LAPAROSCOPIC APPENDECTOMY: SHX408

## 2018-09-13 LAB — CBC
HEMATOCRIT: 41.2 % (ref 36.0–46.0)
Hemoglobin: 13.2 g/dL (ref 12.0–15.0)
MCH: 29.5 pg (ref 26.0–34.0)
MCHC: 32 g/dL (ref 30.0–36.0)
MCV: 92.2 fL (ref 80.0–100.0)
Platelets: 188 10*3/uL (ref 150–400)
RBC: 4.47 MIL/uL (ref 3.87–5.11)
RDW: 11.5 % (ref 11.5–15.5)
WBC: 15.5 10*3/uL — ABNORMAL HIGH (ref 4.0–10.5)
nRBC: 0 % (ref 0.0–0.2)

## 2018-09-13 LAB — COMPREHENSIVE METABOLIC PANEL
ALT: 14 U/L (ref 0–44)
AST: 15 U/L (ref 15–41)
Albumin: 4 g/dL (ref 3.5–5.0)
Alkaline Phosphatase: 61 U/L (ref 38–126)
Anion gap: 5 (ref 5–15)
BUN: 15 mg/dL (ref 6–20)
CHLORIDE: 108 mmol/L (ref 98–111)
CO2: 21 mmol/L — ABNORMAL LOW (ref 22–32)
Calcium: 9.1 mg/dL (ref 8.9–10.3)
Creatinine, Ser: 0.7 mg/dL (ref 0.44–1.00)
GFR calc Af Amer: >60 mL/min (ref >60)
GFR calc non Af Amer: >60 mL/min (ref >60)
GLUCOSE: 128 mg/dL — AB (ref 70–99)
Potassium: 3.4 mmol/L — ABNORMAL LOW (ref 3.5–5.1)
Sodium: 134 mmol/L — ABNORMAL LOW (ref 135–145)
Total Bilirubin: 0.5 mg/dL (ref 0.3–1.2)
Total Protein: 6.9 g/dL (ref 6.5–8.1)

## 2018-09-13 LAB — DIFFERENTIAL
Basophils Absolute: 0.1 10*3/uL (ref 0.0–0.1)
Basophils Relative: 0 %
EOS ABS: 0.2 10*3/uL (ref 0.0–0.5)
Eosinophils Relative: 1 %
LYMPHS ABS: 2.9 10*3/uL (ref 0.7–4.0)
Lymphocytes Relative: 19 %
Monocytes Absolute: 0.6 10*3/uL (ref 0.1–1.0)
Monocytes Relative: 4 %
Neutro Abs: 11.7 10*3/uL — ABNORMAL HIGH (ref 1.7–7.7)
Neutrophils Relative %: 76 %

## 2018-09-13 LAB — LIPASE, BLOOD: Lipase: 37 U/L (ref 11–51)

## 2018-09-13 SURGERY — APPENDECTOMY, LAPAROSCOPIC
Anesthesia: General | Site: Abdomen

## 2018-09-13 MED ORDER — ACETAMINOPHEN 325 MG PO TABS
650.0000 mg | ORAL_TABLET | Freq: Three times a day (TID) | ORAL | Status: DC
  Filled 2018-09-13: qty 2

## 2018-09-13 MED ORDER — SUCCINYLCHOLINE CHLORIDE 200 MG/10ML IV SOSY
PREFILLED_SYRINGE | INTRAVENOUS | Status: AC
  Filled 2018-09-13: qty 10

## 2018-09-13 MED ORDER — FENTANYL CITRATE (PF) 100 MCG/2ML IJ SOLN
50.0000 ug | INTRAMUSCULAR | Status: DC | PRN
  Administered 2018-09-13: 50 ug via INTRAVENOUS
  Filled 2018-09-13: qty 2

## 2018-09-13 MED ORDER — GLYCOPYRROLATE 0.2 MG/ML IJ SOLN
INTRAMUSCULAR | Status: DC | PRN
  Administered 2018-09-13: 0.2 mg via INTRAVENOUS

## 2018-09-13 MED ORDER — ONDANSETRON HCL 4 MG/2ML IJ SOLN
INTRAMUSCULAR | Status: AC
  Filled 2018-09-13: qty 2

## 2018-09-13 MED ORDER — FENTANYL CITRATE (PF) 100 MCG/2ML IJ SOLN
50.0000 ug | Freq: Once | INTRAMUSCULAR | Status: AC
  Administered 2018-09-13: 50 ug via INTRAVENOUS
  Filled 2018-09-13: qty 2

## 2018-09-13 MED ORDER — HYDROMORPHONE HCL 1 MG/ML IJ SOLN
1.0000 mg | INTRAMUSCULAR | Status: DC | PRN
  Administered 2018-09-13: 1 mg via INTRAVENOUS
  Filled 2018-09-13: qty 1

## 2018-09-13 MED ORDER — PROMETHAZINE HCL 25 MG/ML IJ SOLN
6.2500 mg | INTRAMUSCULAR | Status: DC | PRN
  Administered 2018-09-13: 6.25 mg via INTRAVENOUS

## 2018-09-13 MED ORDER — PROPOFOL 10 MG/ML IV BOLUS
INTRAVENOUS | Status: DC | PRN
  Administered 2018-09-13: 100 mg via INTRAVENOUS

## 2018-09-13 MED ORDER — LACTATED RINGERS IV SOLN
INTRAVENOUS | Status: DC
  Administered 2018-09-13: 1000 mL via INTRAVENOUS
  Administered 2018-09-13: 13:00:00 via INTRAVENOUS

## 2018-09-13 MED ORDER — SUGAMMADEX SODIUM 200 MG/2ML IV SOLN
INTRAVENOUS | Status: DC | PRN
  Administered 2018-09-13: 125 mg via INTRAVENOUS

## 2018-09-13 MED ORDER — SUCCINYLCHOLINE CHLORIDE 200 MG/10ML IV SOSY
PREFILLED_SYRINGE | INTRAVENOUS | Status: DC | PRN
  Administered 2018-09-13: 100 mg via INTRAVENOUS

## 2018-09-13 MED ORDER — MORPHINE SULFATE (PF) 4 MG/ML IV SOLN
1.0000 mg | INTRAVENOUS | Status: DC | PRN
  Administered 2018-09-13: 2 mg via INTRAVENOUS
  Filled 2018-09-13: qty 1

## 2018-09-13 MED ORDER — 0.9 % SODIUM CHLORIDE (POUR BTL) OPTIME
TOPICAL | Status: DC | PRN
  Administered 2018-09-13: 1000 mL

## 2018-09-13 MED ORDER — ONDANSETRON HCL 4 MG/2ML IJ SOLN: 4.0000 mg | Freq: Four times a day (QID) | INTRAMUSCULAR | Status: DC | PRN

## 2018-09-13 MED ORDER — BUPIVACAINE-EPINEPHRINE (PF) 0.25% -1:200000 IJ SOLN
INTRAMUSCULAR | Status: AC
  Filled 2018-09-13: qty 30

## 2018-09-13 MED ORDER — FENTANYL CITRATE (PF) 100 MCG/2ML IJ SOLN
INTRAMUSCULAR | Status: AC
  Filled 2018-09-13: qty 4

## 2018-09-13 MED ORDER — FENTANYL CITRATE (PF) 100 MCG/2ML IJ SOLN
25.0000 ug | INTRAMUSCULAR | Status: DC | PRN
  Administered 2018-09-13 (×3): 50 ug via INTRAVENOUS

## 2018-09-13 MED ORDER — ONDANSETRON HCL 4 MG/2ML IJ SOLN
4.0000 mg | Freq: Once | INTRAMUSCULAR | Status: AC
  Administered 2018-09-13: 4 mg via INTRAVENOUS
  Filled 2018-09-13: qty 2

## 2018-09-13 MED ORDER — NICOTINE 14 MG/24HR TD PT24: 14.0000 mg | MEDICATED_PATCH | Freq: Every day | TRANSDERMAL | Status: DC

## 2018-09-13 MED ORDER — ONDANSETRON HCL 4 MG/2ML IJ SOLN
INTRAMUSCULAR | Status: DC | PRN
  Administered 2018-09-13: 4 mg via INTRAVENOUS

## 2018-09-13 MED ORDER — IOPAMIDOL (ISOVUE-300) INJECTION 61%
100.0000 mL | Freq: Once | INTRAVENOUS | Status: AC | PRN
  Administered 2018-09-13: 100 mL via INTRAVENOUS

## 2018-09-13 MED ORDER — OXYCODONE HCL 5 MG PO TABS
10.0000 mg | ORAL_TABLET | ORAL | Status: DC | PRN
  Administered 2018-09-13 – 2018-09-14 (×3): 10 mg via ORAL
  Filled 2018-09-13 (×3): qty 2

## 2018-09-13 MED ORDER — SODIUM CHLORIDE 0.9 % IV SOLN
2.0000 g | Freq: Once | INTRAVENOUS | Status: AC
  Administered 2018-09-13: 2 g via INTRAVENOUS
  Filled 2018-09-13: qty 20

## 2018-09-13 MED ORDER — MIDAZOLAM HCL 2 MG/2ML IJ SOLN
INTRAMUSCULAR | Status: DC | PRN
  Administered 2018-09-13: 2 mg via INTRAVENOUS

## 2018-09-13 MED ORDER — MIDAZOLAM HCL 2 MG/2ML IJ SOLN
INTRAMUSCULAR | Status: AC
  Filled 2018-09-13: qty 2

## 2018-09-13 MED ORDER — ROCURONIUM BROMIDE 100 MG/10ML IV SOLN
INTRAVENOUS | Status: AC
  Filled 2018-09-13: qty 1

## 2018-09-13 MED ORDER — FENTANYL CITRATE (PF) 250 MCG/5ML IJ SOLN
INTRAMUSCULAR | Status: DC | PRN
  Administered 2018-09-13: 50 ug via INTRAVENOUS
  Administered 2018-09-13 (×2): 25 ug via INTRAVENOUS
  Administered 2018-09-13: 100 ug via INTRAVENOUS
  Administered 2018-09-13: 50 ug via INTRAVENOUS

## 2018-09-13 MED ORDER — SODIUM CHLORIDE 0.9 % IV SOLN
2.0000 g | INTRAVENOUS | Status: DC
  Administered 2018-09-14: 2 g via INTRAVENOUS
  Filled 2018-09-13: qty 2

## 2018-09-13 MED ORDER — KETOROLAC TROMETHAMINE 30 MG/ML IJ SOLN
INTRAMUSCULAR | Status: AC
  Filled 2018-09-13: qty 1

## 2018-09-13 MED ORDER — FENTANYL CITRATE (PF) 250 MCG/5ML IJ SOLN
INTRAMUSCULAR | Status: AC
  Filled 2018-09-13: qty 5

## 2018-09-13 MED ORDER — BUPIVACAINE-EPINEPHRINE 0.25% -1:200000 IJ SOLN
INTRAMUSCULAR | Status: DC | PRN
  Administered 2018-09-13: 30 mL

## 2018-09-13 MED ORDER — ONDANSETRON 4 MG PO TBDP: 4.0000 mg | ORAL_TABLET | Freq: Four times a day (QID) | ORAL | Status: DC | PRN

## 2018-09-13 MED ORDER — METRONIDAZOLE IN NACL 5-0.79 MG/ML-% IV SOLN
500.0000 mg | Freq: Once | INTRAVENOUS | Status: AC
  Administered 2018-09-13: 500 mg via INTRAVENOUS
  Filled 2018-09-13: qty 100

## 2018-09-13 MED ORDER — HYDROCODONE-ACETAMINOPHEN 5-325 MG PO TABS
1.0000 | ORAL_TABLET | ORAL | Status: DC | PRN
  Administered 2018-09-13: 2 via ORAL
  Filled 2018-09-13: qty 2

## 2018-09-13 MED ORDER — SUGAMMADEX SODIUM 200 MG/2ML IV SOLN
INTRAVENOUS | Status: AC
  Filled 2018-09-13: qty 2

## 2018-09-13 MED ORDER — PROPOFOL 10 MG/ML IV BOLUS
INTRAVENOUS | Status: AC
  Filled 2018-09-13: qty 20

## 2018-09-13 MED ORDER — POTASSIUM CHLORIDE IN NACL 20-0.9 MEQ/L-% IV SOLN
INTRAVENOUS | Status: DC
  Administered 2018-09-13: 06:00:00 via INTRAVENOUS
  Filled 2018-09-13: qty 1000

## 2018-09-13 MED ORDER — METRONIDAZOLE IN NACL 5-0.79 MG/ML-% IV SOLN
500.0000 mg | Freq: Three times a day (TID) | INTRAVENOUS | Status: DC
  Administered 2018-09-13 – 2018-09-14 (×2): 500 mg via INTRAVENOUS
  Filled 2018-09-13 (×2): qty 100

## 2018-09-13 MED ORDER — FENTANYL CITRATE (PF) 100 MCG/2ML IJ SOLN
100.0000 ug | Freq: Once | INTRAMUSCULAR | Status: AC
  Administered 2018-09-13: 100 ug via INTRAVENOUS
  Filled 2018-09-13: qty 2

## 2018-09-13 MED ORDER — DIPHENHYDRAMINE HCL 25 MG PO CAPS
25.0000 mg | ORAL_CAPSULE | Freq: Four times a day (QID) | ORAL | Status: DC | PRN
  Administered 2018-09-14: 25 mg via ORAL
  Filled 2018-09-13: qty 1

## 2018-09-13 MED ORDER — LACTATED RINGERS IR SOLN
Status: DC | PRN
  Administered 2018-09-13: 1000 mL

## 2018-09-13 MED ORDER — KCL IN DEXTROSE-NACL 20-5-0.45 MEQ/L-%-% IV SOLN
INTRAVENOUS | Status: DC
  Administered 2018-09-13 – 2018-09-14 (×2): via INTRAVENOUS
  Filled 2018-09-13 (×2): qty 1000

## 2018-09-13 MED ORDER — PROMETHAZINE HCL 25 MG/ML IJ SOLN
INTRAMUSCULAR | Status: AC
  Filled 2018-09-13: qty 1

## 2018-09-13 MED ORDER — KETOROLAC TROMETHAMINE 30 MG/ML IJ SOLN
30.0000 mg | Freq: Four times a day (QID) | INTRAMUSCULAR | Status: DC
  Administered 2018-09-13 – 2018-09-14 (×4): 30 mg via INTRAVENOUS
  Filled 2018-09-13 (×3): qty 1

## 2018-09-13 MED ORDER — MORPHINE SULFATE (PF) 4 MG/ML IV SOLN
4.0000 mg | Freq: Once | INTRAVENOUS | Status: AC
  Administered 2018-09-13: 4 mg via INTRAVENOUS
  Filled 2018-09-13 (×2): qty 1

## 2018-09-13 MED ORDER — DIPHENHYDRAMINE HCL 50 MG/ML IJ SOLN: 25.0000 mg | Freq: Four times a day (QID) | INTRAMUSCULAR | Status: DC | PRN

## 2018-09-13 MED ORDER — ACETAMINOPHEN 500 MG PO TABS
1000.0000 mg | ORAL_TABLET | Freq: Four times a day (QID) | ORAL | Status: DC
  Filled 2018-09-13: qty 2

## 2018-09-13 MED ORDER — ROCURONIUM BROMIDE 10 MG/ML (PF) SYRINGE
PREFILLED_SYRINGE | INTRAVENOUS | Status: DC | PRN
  Administered 2018-09-13: 25 mg via INTRAVENOUS

## 2018-09-13 SURGICAL SUPPLY — 43 items
ADH SKN CLS APL DERMABOND .7 (GAUZE/BANDAGES/DRESSINGS) ×1
APL SKNCLS STERI-STRIP NONHPOA (GAUZE/BANDAGES/DRESSINGS)
APPLIER CLIP ROT 10 11.4 M/L (STAPLE)
APR CLP MED LRG 11.4X10 (STAPLE)
BAG SPEC RTRVL 10 TROC 200 (ENDOMECHANICALS) ×1
BAG SPEC RTRVL LRG 6X4 10 (ENDOMECHANICALS) ×1
BENZOIN TINCTURE PRP APPL 2/3 (GAUZE/BANDAGES/DRESSINGS) IMPLANT
CABLE HI FREQUENCY MONOPOLAR (ELECTROSURGICAL) ×1 IMPLANT
CABLE HIGH FREQUENCY MONO STRZ (ELECTRODE) ×2 IMPLANT
CHLORAPREP W/TINT 26ML (MISCELLANEOUS) ×2 IMPLANT
CLIP APPLIE ROT 10 11.4 M/L (STAPLE) IMPLANT
COVER SURGICAL LIGHT HANDLE (MISCELLANEOUS) ×2 IMPLANT
COVER WAND RF STERILE (DRAPES) ×1 IMPLANT
CUTTER FLEX LINEAR 45M (STAPLE) ×1 IMPLANT
DECANTER SPIKE VIAL GLASS SM (MISCELLANEOUS) ×1 IMPLANT
DERMABOND ADVANCED (GAUZE/BANDAGES/DRESSINGS) ×1
DERMABOND ADVANCED .7 DNX12 (GAUZE/BANDAGES/DRESSINGS) ×1 IMPLANT
DRAPE LAPAROSCOPIC ABDOMINAL (DRAPES) ×2 IMPLANT
ELECT REM PT RETURN 15FT ADLT (MISCELLANEOUS) ×2 IMPLANT
ENDOLOOP SUT PDS II  0 18 (SUTURE)
ENDOLOOP SUT PDS II 0 18 (SUTURE) IMPLANT
GLOVE SURG SIGNA 7.5 PF LTX (GLOVE) ×2 IMPLANT
GOWN STRL REUS W/TWL XL LVL3 (GOWN DISPOSABLE) ×4 IMPLANT
KIT BASIN OR (CUSTOM PROCEDURE TRAY) ×2 IMPLANT
POUCH RETRIEVAL ECOSAC 10 (ENDOMECHANICALS) IMPLANT
POUCH RETRIEVAL ECOSAC 10MM (ENDOMECHANICALS) ×1
POUCH SPECIMEN RETRIEVAL 10MM (ENDOMECHANICALS) ×2 IMPLANT
RELOAD 45 VASCULAR/THIN (ENDOMECHANICALS) IMPLANT
RELOAD STAPLE 45 2.5 WHT GRN (ENDOMECHANICALS) IMPLANT
RELOAD STAPLE 45 3.5 BLU ETS (ENDOMECHANICALS) IMPLANT
RELOAD STAPLE TA45 3.5 REG BLU (ENDOMECHANICALS) ×2 IMPLANT
SCISSORS LAP 5X35 DISP (ENDOMECHANICALS) ×2 IMPLANT
SET IRRIG TUBING LAPAROSCOPIC (IRRIGATION / IRRIGATOR) ×2 IMPLANT
SHEARS HARMONIC ACE PLUS 36CM (ENDOMECHANICALS) ×2 IMPLANT
SLEEVE XCEL OPT CAN 5 100 (ENDOMECHANICALS) ×2 IMPLANT
STRIP CLOSURE SKIN 1/2X4 (GAUZE/BANDAGES/DRESSINGS) IMPLANT
SUT MNCRL AB 4-0 PS2 18 (SUTURE) ×2 IMPLANT
SUT VIC AB 2-0 SH 18 (SUTURE) IMPLANT
TOWEL OR 17X26 10 PK STRL BLUE (TOWEL DISPOSABLE) ×2 IMPLANT
TOWEL OR NON WOVEN STRL DISP B (DISPOSABLE) ×2 IMPLANT
TRAY LAPAROSCOPIC (CUSTOM PROCEDURE TRAY) ×2 IMPLANT
TROCAR BLADELESS OPT 5 100 (ENDOMECHANICALS) ×2 IMPLANT
TROCAR XCEL BLUNT TIP 100MML (ENDOMECHANICALS) ×2 IMPLANT

## 2018-09-13 NOTE — ED Notes (Signed)
[facility] notified (Tara) - patient ready for transport 

## 2018-09-13 NOTE — Anesthesia Postprocedure Evaluation (Signed)
Anesthesia Post Note  Patient: Patricia Olson  Procedure(s) Performed: APPENDECTOMY LAPAROSCOPIC (N/A Abdomen)     Patient location during evaluation: PACU Anesthesia Type: General Level of consciousness: awake and alert Pain management: pain level controlled Vital Signs Assessment: post-procedure vital signs reviewed and stable Respiratory status: spontaneous breathing, nonlabored ventilation, respiratory function stable and patient connected to nasal cannula oxygen Cardiovascular status: blood pressure returned to baseline and stable Postop Assessment: no apparent nausea or vomiting Anesthetic complications: no    Last Vitals:  Vitals:   09/13/18 1400 09/13/18 1507  BP: (!) 94/58 91/66  Pulse: (!) 58 (!) 58  Resp: 14 18  Temp: 36.6 C 36.6 C  SpO2: 100% 99%    Last Pain:  Vitals:   09/13/18 1507  TempSrc: Oral  PainSc:                  Kennieth Rad

## 2018-09-13 NOTE — Anesthesia Procedure Notes (Signed)
Procedure Name: Intubation Date/Time: 09/13/2018 11:56 AM Performed by: Thornell Mule, CRNA Pre-anesthesia Checklist: Patient identified, Emergency Drugs available, Suction available and Patient being monitored Patient Re-evaluated:Patient Re-evaluated prior to induction Oxygen Delivery Method: Circle system utilized Preoxygenation: Pre-oxygenation with 100% oxygen Induction Type: IV induction Ventilation: Mask ventilation without difficulty Laryngoscope Size: Miller and 3 Grade View: Grade I Tube type: Oral Tube size: 7.0 mm Number of attempts: 1 Airway Equipment and Method: Stylet and Oral airway Placement Confirmation: ETT inserted through vocal cords under direct vision,  positive ETCO2 and breath sounds checked- equal and bilateral Secured at: 20 cm Tube secured with: Tape Dental Injury: Teeth and Oropharynx as per pre-operative assessment

## 2018-09-13 NOTE — ED Notes (Signed)
Pt instructed on how to preform CHG bath due to pt wanting to do it her self. RN made aware. Pt was given clean gown and  instructed to change into clean gown after CHG.

## 2018-09-13 NOTE — Progress Notes (Signed)
Patient asking to go outside and smoke; informed her that this is a NO Smoking campus. Patient states "I'll go to my car and drive off campus". Educated patient regarding safely issues due to recent surgery, anesthesia and narcotics received make it unsafe for her to do that. Dr Ezzard Standing paged to make him aware and that patient says "patches and pills for smoking don't work for me".  Orders noted and patient made aware. Patient stated "well, if I can't smoke, I have to eat". Educated patient on ordering food. Lina Sar, RN

## 2018-09-13 NOTE — Op Note (Signed)
Re:   Patricia Olson DOB:   [birth year] MRN:   [id number]                   FACILITY:  [facility]  DATE OF PROCEDURE: 09/13/2018                              OPERATIVE REPORT  PREOPERATIVE DIAGNOSIS:  Appendicitis  POSTOPERATIVE DIAGNOSIS:  Acute purulent appendicitis.  PROCEDURE:  Laparoscopic appendectomy.  SURGEON:  Sandria Bales. Ezzard Standing, MD  ASSISTANT:  No first assistant.  ANESTHESIA:  General endotracheal.  Anesthesiologist: Marcene Duos, MD CRNA: Thornell Mule, CRNA  ASA:  2E  ESTIMATED BLOOD LOSS:  Minimal.  DRAINS: none   SPECIMEN:   Appendix  COUNTS CORRECT:  YES  INDICATIONS FOR PROCEDURE: [name] is a 38 y.o. (DOB: [birth year]) white female whose primary care doctor is [facility] and comes to the OR for an appendectomy.   I discussed with the patient, the indications and potential complications of appendiceal surgery.  The potential complications include, but are not limited to, bleeding, open surgery, bowel resection, and the possibility of another diagnosis.  OPERATIVE NOTE:  The patient underwent a general endotracheal anesthetic as supervised by Anesthesiologist: Marcene Duos, MD CRNA: Thornell Mule, CRNA, General, in [facility] OR room #4.  The patient was given antibiotics prior to the beginning of the procedure and the abdomen was prepped with ChloraPrep.   A time-out was held and surgical checklist run.  An infraumbilical incision was made with sharp dissection carried down to the abdominal cavity.  An 12 mm Hasson trocar was inserted through the infraumbilical incision and into the peritoneal cavity.  A 30 degree 5 mm laparoscope was inserted through a 12 mm Hasson trocar and the Hasson trocar secured with a 0 Vicryl suture.  I placed a 5 mm trocar in the right upper quadrant and a 5 mm torcar in left lower quadrant and did abdominal exploration.    The right and left lobes of liver unremarkable.  Stomach was unremarkable.   The uterus was absent.  There were adhesions of the small bowel to the cuff of the vagina.  I saw no other intra-abdominal abnormality.  The patient had purulent appendicitis with the appendix located right pelvic brim.  The appendix was not ruptured.  The mesentery of the appendix was divided with a Harmonic scalpel.  I got to the base of the appendix.  I then used a blue load 45 mm Ethicon Endo-GIA stapler and fired this across the base of the appendix.  I placed the appendix in EccoSac bag and delivered the bag through the umbilical incision.  I irrigated the abdomen with 500 cc of saline.  After irrigating the abdomen, I then removed the trocars, in turn.  The umbilical port fascia was closed with 0 Vicryl suture.   I closed the skin each site with a 4-0 Monocryl suture and painted the wounds with DermaBond.  I then injected a total of 30 mL of 0.25% Marcaine at the incisions.  Sponge and needle count were correct at the end of the case.  The patient was transferred to the recovery room in good condition.  The patient tolerated the procedure well and it depends on the patient's post op clinical course as to when the patient could be discharged.   [name], MD, [facility] Surgery Pager: [phone] Office phone:  [phone]

## 2018-09-13 NOTE — ED Triage Notes (Signed)
PT reports generalized abdominal pain with 1 episode of vomiting tonight. Denies diarrhea.

## 2018-09-13 NOTE — Transfer of Care (Signed)
Immediate Anesthesia Transfer of Care Note  Patient: Patricia Olson  Procedure(s) Performed: APPENDECTOMY LAPAROSCOPIC (N/A Abdomen)  Patient Location: PACU  Anesthesia Type:General  Level of Consciousness: awake, alert  and oriented  Airway & Oxygen Therapy: Patient Spontanous Breathing and Patient connected to face mask oxygen  Post-op Assessment: Report given to RN and Post -op Vital signs reviewed and stable  Post vital signs: Reviewed and stable  Last Vitals:  Vitals Value Taken Time  BP 111/82 09/13/2018  1:01 PM  Temp    Pulse 96 09/13/2018  1:03 PM  Resp 20 09/13/2018  1:03 PM  SpO2 100 % 09/13/2018  1:03 PM  Vitals shown include unvalidated device data.  Last Pain:  Vitals:   09/13/18 1129  PainSc: 3       Patients Stated Pain Goal: 4 (09/13/18 1129)  Complications: No apparent anesthesia complications

## 2018-09-13 NOTE — ED Notes (Signed)
Consent has been obtained for Laparoscopic Appendectomy.
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
## 2018-09-13 NOTE — Anesthesia Preprocedure Evaluation (Addendum)
Anesthesia Evaluation  Patient identified by MRN, date of birth, ID band Patient awake    Reviewed: Allergy & Precautions, NPO status , Patient's Chart, lab work & pertinent test results  Airway Mallampati: II  TM Distance: >3 FB Neck ROM: Full    Dental  (+) Dental Advisory Given   Pulmonary Current Smoker,    breath sounds clear to auscultation       Cardiovascular negative cardio ROS   Rhythm:Regular Rate:Normal     Neuro/Psych negative neurological ROS     GI/Hepatic Neg liver ROS, Acute appendicitis   Endo/Other  negative endocrine ROS  Renal/GU negative Renal ROS     Musculoskeletal   Abdominal   Peds  Hematology negative hematology ROS (+)   Anesthesia Other Findings   Reproductive/Obstetrics                             Lab Results  Component Value Date   WBC 15.5 (H) 09/13/2018   HGB 13.2 09/13/2018   HCT 41.2 09/13/2018   MCV 92.2 09/13/2018   PLT 188 09/13/2018   Lab Results  Component Value Date   CREATININE 0.70 09/13/2018   BUN 15 09/13/2018   NA 134 (L) 09/13/2018   K 3.4 (L) 09/13/2018   CL 108 09/13/2018   CO2 21 (L) 09/13/2018    Anesthesia Physical Anesthesia Plan  ASA: II and emergent  Anesthesia Plan: General   Post-op Pain Management:    Induction: Intravenous  PONV Risk Score and Plan: 2 and Dexamethasone, Ondansetron and Treatment may vary due to age or medical condition  Airway Management Planned: Oral ETT  Additional Equipment:   Intra-op Plan:   Post-operative Plan: Extubation in OR  Informed Consent: I have reviewed the patients History and Physical, chart, labs and discussed the procedure including the risks, benefits and alternatives for the proposed anesthesia with the patient or authorized representative who has indicated his/her understanding and acceptance.     Dental advisory given  Plan Discussed with: CRNA  Anesthesia  Plan Comments:         Anesthesia Quick Evaluation

## 2018-09-13 NOTE — ED Notes (Addendum)
While pt was having her vital signs were taken, pt said "I am going to rip this stuff off me. I don't need to keep this on me." Pt was advised that it was kept on to monitor her vital signs. Pt refused. Pt was also advised that a urine sample was ordered. Pt said she had already urinated at the last hospital.

## 2018-09-13 NOTE — ED Notes (Signed)
Pt also states that she has swelling in the face that started two days ago.
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
## 2018-09-13 NOTE — ED Provider Notes (Signed)
Patient transferred from [facility] for acute appendicitis.  Dr. Magnus IvanBlackman, general surgeon had already been contacted.  She was finishing her IV antibiotics on arrival to our ED.  She is complaining of a lot of pain.  She states her pain started at 9:30 PM tonight and it is diffuse.  Patient was given morphine IV for pain.  Dr. Magnus IvanBlackman was paged to let him know she is in the ED.  5:10 AM Dr. Magnus IvanBlackman put in admission orders, he did not return his page.   Results for orders placed or performed during the hospital encounter of 09/13/18  Lipase, blood  Result Value Ref Range   Lipase 37 11 - 51 U/L  Comprehensive metabolic panel  Result Value Ref Range   Sodium 134 (L) 135 - 145 mmol/L   Potassium 3.4 (L) 3.5 - 5.1 mmol/L   Chloride 108 98 - 111 mmol/L   CO2 21 (L) 22 - 32 mmol/L   Glucose, Bld 128 (H) 70 - 99 mg/dL   BUN 15 6 - 20 mg/dL   Creatinine, Ser 1.190.70 0.44 - 1.00 mg/dL   Calcium 9.1 8.9 - 14.710.3 mg/dL   Total Protein 6.9 6.5 - 8.1 g/dL   Albumin 4.0 3.5 - 5.0 g/dL   AST 15 15 - 41 U/L   ALT 14 0 - 44 U/L   Alkaline Phosphatase 61 38 - 126 U/L   Total Bilirubin 0.5 0.3 - 1.2 mg/dL   GFR calc non Af Amer >60 >60 mL/min   GFR calc Af Amer >60 >60 mL/min   Anion gap 5 5 - 15  CBC  Result Value Ref Range   WBC 15.5 (H) 4.0 - 10.5 K/uL   RBC 4.47 3.87 - 5.11 MIL/uL   Hemoglobin 13.2 12.0 - 15.0 g/dL   HCT 82.941.2 56.236.0 - 13.046.0 %   MCV 92.2 80.0 - 100.0 fL   MCH 29.5 26.0 - 34.0 pg   MCHC 32.0 30.0 - 36.0 g/dL   RDW 86.511.5 78.411.5 - 69.615.5 %   Platelets 188 150 - 400 K/uL   nRBC 0.0 0.0 - 0.2 %  Differential  Result Value Ref Range   Neutrophils Relative % 76 %   Neutro Abs 11.7 (H) 1.7 - 7.7 K/uL   Lymphocytes Relative 19 %   Lymphs Abs 2.9 0.7 - 4.0 K/uL   Monocytes Relative 4 %   Monocytes Absolute 0.6 0.1 - 1.0 K/uL   Eosinophils Relative 1 %   Eosinophils Absolute 0.2 0.0 - 0.5 K/uL   Basophils Relative 0 %   Basophils Absolute 0.1 0.0 - 0.1 K/uL   Laboratory  interpretation all normal except leukocytosis    Ct Abdomen Pelvis W Contrast  Result Date: 09/13/2018 CLINICAL DATA:  Generalized abdominal pain for 5 hours. Vomiting today. EXAM: CT ABDOMEN AND PELVIS WITH CONTRAST TECHNIQUE: Multidetector CT imaging of the abdomen and pelvis was performed using the standard protocol following bolus administration of intravenous contrast. CONTRAST:  100mL ISOVUE-300 IOPAMIDOL (ISOVUE-300) INJECTION 61% COMPARISON:  12/28/2017 FINDINGS: Lower chest: Lung bases are clear. Hepatobiliary: Few scattered subcentimeter low-attenuation lesions in the liver. No change since prior study. Too small to characterize but likely cysts or hemangiomas. Gallbladder and bile ducts are unremarkable. Pancreas: Unremarkable. No pancreatic ductal dilatation or surrounding inflammatory changes. Spleen: Normal in size without focal abnormality. Adrenals/Urinary Tract: Adrenal glands are unremarkable. Kidneys are normal, without renal calculi, focal lesion, or hydronephrosis. Bladder is decompressed. Stomach/Bowel: Stomach, small bowel, and colon are  not abnormally distended. Scattered stool throughout the colon. The appendix is mildly distended with diameter measuring 11 mm. Appendiceal wall appears thickened and hyperemic with mild periappendiceal stranding. Changes suggest early acute appendicitis. Appendix: Location: Right pelvis, retrocecal Diameter: 11 mm Appendicolith: No Mucosal hyper-enhancement: Yes Extraluminal gas: No Periappendiceal collection: No Vascular/Lymphatic: No significant vascular findings are present. No enlarged abdominal or pelvic lymph nodes. Reproductive: Uterus is surgically absent. Small cyst on the left ovary. Likely physiologic. Other: No abdominal wall hernia or abnormality. No abdominopelvic ascites. Musculoskeletal: No acute or significant osseous findings. IMPRESSION: Mildly distended fluid-filled appendix with wall thickening and mild stranding suggesting early  acute appendicitis. No abscess. Electronically Signed   By: Burman Nieves M.D.   On: 09/13/2018 03:07     Devoria Albe, MD 09/13/18 [phone]

## 2018-09-14 ENCOUNTER — Encounter (HOSPITAL_COMMUNITY): Payer: Self-pay | Admitting: Surgery

## 2018-09-14 ENCOUNTER — Other Ambulatory Visit: Payer: Self-pay | Admitting: Surgery

## 2018-09-14 LAB — HIV ANTIBODY (ROUTINE TESTING W REFLEX): HIV Screen 4th Generation wRfx: NONREACTIVE

## 2018-09-14 MED ORDER — OXYCODONE HCL 5 MG PO TABS: 5.0000 mg | ORAL_TABLET | Freq: Four times a day (QID) | ORAL | 0 refills | Status: DC | PRN

## 2018-09-14 MED ORDER — ACETAMINOPHEN 500 MG PO TABS: 1000.0000 mg | ORAL_TABLET | Freq: Four times a day (QID) | ORAL | 0 refills | Status: DC

## 2018-09-14 MED ORDER — IBUPROFEN 200 MG PO TABS: ORAL_TABLET | ORAL | 2 refills | Status: DC

## 2018-09-14 NOTE — Discharge Instructions (Signed)
[facility] ______[facility] °LAPAROSCOPIC SURGERY: POST OP INSTRUCTIONS °Always review your discharge instruction sheet given to you by the facility where your surgery was performed. °IF YOU HAVE DISABILITY OR FAMILY LEAVE FORMS, YOU MUST BRING THEM TO THE OFFICE FOR PROCESSING.   °DO NOT GIVE THEM TO YOUR DOCTOR. ° °1. A prescription for pain medication may be given to you upon discharge.  Take your pain medication as prescribed, if needed.  If narcotic pain medicine is not needed, then you may take acetaminophen (Tylenol) or ibuprofen (Advil) as needed. °2. Take your usually prescribed medications unless otherwise directed. °3. If you need a refill on your pain medication, please contact your pharmacy.  They will contact our office to request authorization. Prescriptions will not be filled after 5pm or on week-ends. °4. You should follow a light diet the first few days after arrival home, such as soup and crackers, etc.  Be sure to include lots of fluids daily. °5. Most patients will experience some swelling and bruising in the area of the incisions.  Ice packs will help.  Swelling and bruising can take several days to resolve.  °6. It is common to experience some constipation if taking pain medication after surgery.  Increasing fluid intake and taking a stool softener (such as Colace) will usually help or prevent this problem from occurring.  A mild laxative (Milk of Magnesia or Miralax) should be taken according to package instructions if there are no bowel movements after 48 hours. °7. Unless discharge instructions indicate otherwise, you may remove your bandages 24-48 hours after surgery, and you may shower at that time.  You may have steri-strips (small skin tapes) in place directly over the incision.  These strips should be left on the skin for 7-10 days.  If your surgeon used skin glue on the incision, you may shower in 24 hours.  The glue will flake off over the next 2-3 weeks.  Any sutures or  staples will be removed at the office during your follow-up visit. °8. ACTIVITIES:  You may resume regular (light) daily activities beginning the next day--such as daily self-care, walking, climbing stairs--gradually increasing activities as tolerated.  You may have sexual intercourse when it is comfortable.  Refrain from any heavy lifting or straining until approved by your doctor. °a. You may drive when you are no longer taking prescription pain medication, you can comfortably wear a seatbelt, and you can safely maneuver your car and apply brakes. °b. RETURN TO WORK:  __________________________________________________________ °9. You should see your doctor in the office for a follow-up appointment approximately 2-3 weeks after your surgery.  Make sure that you call for this appointment within a day or two after you arrive home to insure a convenient appointment time. °10. OTHER INSTRUCTIONS: __________________________________________________________________________________________________________________________ __________________________________________________________________________________________________________________________ °WHEN TO CALL YOUR DOCTOR: °1. Fever over 101.0 °2. Inability to urinate °3. Continued bleeding from incision. °4. Increased pain, redness, or drainage from the incision. °5. Increasing abdominal pain ° °The clinic staff is available to answer your questions during regular business hours.  Please don’t hesitate to call and ask to speak to one of the nurses for clinical concerns.  If you have a medical emergency, go to the nearest emergency room or call 911.  A surgeon from [facility] is always on call at the hospital. °[address], [address], [place], [place]  [address] ? [address] [address], [place], [place]   [address] °[phone] ? [phone] ? FAX [phone] °Web site:   [email or web address] ° °GETTING TO GOOD BOWEL HEALTH. °Irregular bowel habits such  as constipation and diarrhea can lead to many problems over time.  Having one soft bowel movement a day is the most important way to prevent further problems.  The anorectal canal is designed to handle stretching and feces to safely manage our ability to get rid of solid waste (feces, poop, stool) out of our body.  BUT, hard constipated stools can act like ripping concrete bricks and diarrhea can be a burning fire to this very sensitive area of our body, causing inflamed hemorrhoids, anal fissures, increasing risk is perirectal abscesses, abdominal pain/bloating, an making irritable bowel worse.     °The goal: ONE SOFT BOWEL MOVEMENT A DAY!  To have soft, regular bowel movements:  °• Drink at least 8 tall glasses of water a day.   °• Take plenty of fiber.  Fiber is the undigested part of plant food that passes into the colon, acting s “natures broom” to encourage bowel motility and movement.  Fiber can absorb and hold large amounts of water. This results in a larger, bulkier stool, which is soft and easier to pass. Work gradually over several weeks up to 6 servings a day of fiber (25g a day even more if needed) in the form of: °o Vegetables -- Root (potatoes, carrots, turnips), leafy green (lettuce, salad greens, celery, spinach), or cooked high residue (cabbage, broccoli, etc) °o Fruit -- Fresh (unpeeled skin & pulp), Dried (prunes, apricots, cherries, etc ),  or stewed ( applesauce)  °o Whole grain breads, pasta, etc (whole wheat)  °o Bran cereals  °• Bulking Agents -- This type of water-retaining fiber generally is easily obtained each day by one of the following:  °o Psyllium bran -- The psyllium plant is remarkable because its ground seeds can retain so much water. This product is available as Metamucil, Konsyl, Effersyllium, Per Diem Fiber, or the less expensive generic preparation in drug and health food stores. Although labeled a laxative, it really is not a laxative.  °o Methylcellulose -- This is another  fiber derived from wood which also retains water. It is available as Citrucel. °o Polyethylene Glycol - and “artificial” fiber commonly called Miralax or Glycolax.  It is helpful for people with gassy or bloated feelings with regular fiber °o Flax Seed - a less gassy fiber than psyllium °• No reading or other relaxing activity while on the toilet. If bowel movements take longer than 5 minutes, you are too constipated °• AVOID CONSTIPATION.  High fiber and water intake usually takes care of this.  Sometimes a laxative is needed to stimulate more frequent bowel movements, but  °• Laxatives are not a good long-term solution as it can wear the colon out. °o Osmotics (Milk of Magnesia, Fleets phosphosoda, Magnesium citrate, MiraLax, GoLytely) are safer than  °o Stimulants (Senokot, Castor Oil, Dulcolax, Ex Lax)    °o Do not take laxatives for more than 7days in a row. °•  IF SEVERELY CONSTIPATED, try a Bowel Retraining Program: °o Do not use laxatives.  °o Eat a diet high in roughage, such as bran cereals and leafy vegetables.  °o Drink six (6) ounces of prune or apricot juice each morning.  °o Eat two (2) large servings of stewed fruit each day.  °o Take one (1) heaping tablespoon of a psyllium-based bulking agent twice a day. Use sugar-free sweetener when possible to avoid excessive calories.  °o Eat a normal breakfast.  °o Set aside 15 minutes   after breakfast to sit on the toilet, but do not strain to have a bowel movement.  °o If you do not have a bowel movement by the third day, use an enema and repeat the above steps.  °• Controlling diarrhea °o Switch to liquids and simpler foods for a few days to avoid stressing your intestines further. °o Avoid dairy products (especially milk & ice cream) for a short time.  The intestines often can lose the ability to digest lactose when stressed. °o Avoid foods that cause gassiness or bloating.  Typical foods include beans and other legumes, cabbage, broccoli, and dairy foods.   Every person has some sensitivity to other foods, so listen to our body and avoid those foods that trigger problems for you. °o Adding fiber (Citrucel, Metamucil, psyllium, Miralax) gradually can help thicken stools by absorbing excess fluid and retrain the intestines to act more normally.  Slowly increase the dose over a few weeks.  Too much fiber too soon can backfire and cause cramping & bloating. °o Probiotics (such as active yogurt, Align, etc) may help repopulate the intestines and colon with normal bacteria and calm down a sensitive digestive tract.  Most studies show it to be of mild help, though, and such products can be costly. °o Medicines: °- Bismuth subsalicylate (ex. Kayopectate, Pepto Bismol) every 30 minutes for up to 6 doses can help control diarrhea.  Avoid if pregnant. °- Loperamide (Immodium) can slow down diarrhea.  Start with two tablets (4mg total) first and then try one tablet every 6 hours.  Avoid if you are having fevers or severe pain.  If you are not better or start feeling worse, stop all medicines and call your doctor for advice °o Call your doctor if you are getting worse or not better.  Sometimes further testing (cultures, endoscopy, X-ray studies, bloodwork, etc) may be needed to help diagnose and treat the cause of the diarrhea. °

## 2018-09-14 NOTE — Discharge Summary (Addendum)
Physician Discharge Summary  Patient ID: Patricia Olson MRN: [id number] DOB/AGE: 39-02-1981 39 y.o.  Admit date: 09/13/2018 Discharge date: 09/14/2018  Admission Diagnoses: Acute appendicitis Hx depression/OCD Hx prior appendectomy/bladder suspension Tobacco use -ongoing  Discharge Diagnoses:  Acute purulent appendicitis Hx depression/OCD Hx prior appendectomy/bladder suspension Tobacco use -ongoing  Active Problems:   Acute appendicitis   PROCEDURES: Laparoscopic appendectomy 09/13/2018, Dr. Alfredia Ferguson Course:   Patient is a 38 year old female presented to the ED with acute onset of abdominal pain last evening around 9:30 PM, followed by vomiting that started around midnight last evening.  She reported feeling bad all over about 24 hours before the onset of vomiting last evening.  She described the abdominal pain as feeling like being stabbed all over.   Work-up in the ED shows she is afebrile vital signs are stable.  Sodium is 134 potassium is 3.4, CO2 is 21, glucose 128.  Remainder of the CMP is normal.  WBC is 15.5, hemoglobin 13.2, hematocrit 41.2.  CT scan shows the appendix is mildly distended measuring 11 mm in diameter.  The appendiceal wall appears thickened and hyperemic with mild periappendiceal stranding suggestive of acute appendicitis. No abscess was noted.  Patient was transferred to [facility] and we are asked to see.  Patient was admitted.  She was having significant issues with pain control.  This improved with Dilaudid and Toradol.  She was taken to the operating room that afternoon and underwent laparoscopic appendectomy.  Was found to be purulent.  Postop she also had some issues with pain.  Her diet was advanced she was mobilized and she was discharged home on the first postoperative day.   Condition on discharge: Improved  CBC    Component Value Date/Time   WBC 15.5 (H) 09/13/2018 0202   RBC 4.47 09/13/2018 0202   HGB 13.2 09/13/2018  0202   HCT 41.2 09/13/2018 0202   PLT 188 09/13/2018 0202   MCV 92.2 09/13/2018 0202   MCH 29.5 09/13/2018 0202   MCHC 32.0 09/13/2018 0202   RDW 11.5 09/13/2018 0202   LYMPHSABS 2.9 09/13/2018 0202   MONOABS 0.6 09/13/2018 0202   EOSABS 0.2 09/13/2018 0202   BASOSABS 0.1 09/13/2018 0202   CMP Latest Ref Rng & Units 09/13/2018 07/06/2018 08/26/2017  Glucose 70 - 99 mg/dL 563(O) 75(I) 433(I)  BUN 6 - 20 mg/dL 15 16 16   Creatinine 0.44 - 1.00 mg/dL 9.51 8.84 1.66  Sodium 135 - 145 mmol/L 134(L) 138 140  Potassium 3.5 - 5.1 mmol/L 3.4(L) 3.4(L) 3.1(L)  Chloride 98 - 111 mmol/L 108 103 111  CO2 22 - 32 mmol/L 21(L) 26 22  Calcium 8.9 - 10.3 mg/dL 9.1 8.9 0.6(T)  Total Protein 6.5 - 8.1 g/dL 6.9 7.0 6.7  Total Bilirubin 0.3 - 1.2 mg/dL 0.5 0.4 0.6  Alkaline Phos 38 - 126 U/L 61 61 46  AST 15 - 41 U/L 15 14(L) 14(L)  ALT 0 - 44 U/L 14 14 10(L)   Admission CT scan 09/13/2018: Mildly distended fluid-filled appendix with wall thickening and mild stranding suggesting early acute appendicitis.  No abscess.  Disposition: Home   Allergies as of 09/14/2018   No Known Allergies     Medication List    TAKE these medications   acetaminophen 500 MG tablet Commonly known as:  TYLENOL Take 2 tablets (1,000 mg total) by mouth every 6 (six) hours.   amphetamine-dextroamphetamine 10 MG tablet Commonly known as:  ADDERALL Take 10 mg by  mouth daily with breakfast.   ibuprofen 200 MG tablet Commonly known as:  MOTRIN IB You can take 2 to 3 tablets every 6 hours as needed for pain.  Not exceed this.  He can buy them over-the-counter at any drugstore.   oxyCODONE 5 MG immediate release tablet Commonly known as:  Oxy IR/ROXICODONE Take 1 tablet (5 mg total) by mouth every 6 (six) hours as needed for moderate pain, severe pain or breakthrough pain.      Follow-up Information    [facility] Follow up on 09/28/2018.   Specialty:  General Surgery Why:  Your appointment is at 2:45  PM.  Be at the office 30 minutes early for check in. Bring photo ID and insurance information with you to check in.   Contact information: [address] [place] [address] [phone]           Signed: Sherrie George 09/14/2018, 2:09 PM  Agree with above.  [name], MD, [facility] Pager: [phone] Office phone:  [phone]

## 2018-09-14 NOTE — Progress Notes (Signed)
Pt alert and oriented, tolerating diet.  D/C instructions given and all questions answered. Pt was d/cd home with spouse.

## 2018-09-14 NOTE — Progress Notes (Addendum)
1 Day Post-Op    CC:  Abdominal pain  Subjective:  She is doing a lot better this a.m. and is dying for cigarette.  We talked about pain control and her postop activity.  She is ready to go home.  Is tolerating her diet well but has not had a bowel movement, and is concerned about constipation.  Objective: Vital signs in last 24 hours: Temp:  [97.8 F (36.6 C)-98.7 F (37.1 C)] 98.4 F (36.9 C) (01/16 0534) Pulse Rate:  [50-86] 55 (01/16 0629) Resp:  [12-19] 15 (01/16 0534) BP: (87-111)/(56-82) 90/57 (01/16 0629) SpO2:  [97 %-100 %] 97 % (01/16 0534) Weight:  [57.6 kg] 57.6 kg (01/15 1129) Last BM Date: 09/12/18 For 20 p.o. 2200 IV 575 urine No BM recorded Afebrile vital signs are stable No labs. Pain control: Refused Tylenol, Vicodin x2 also said she did not want that anymore Dilaudid x1 postop, Toradol 30 mg every 6, oxycodone 10 mg x 3 last p.m. and this a.m. Intake/Output from previous day: 01/15 0701 - 01/16 0700 In: 2637.2 [P.O.:420; I.V.:2017.2; IV Piggyback:200] Out: 595 [Urine:575; Blood:20] Intake/Output this shift: No intake/output data recorded.  General appearance: alert, cooperative and no distress Resp: clear to auscultation bilaterally GI: Soft, sore, some ecchymosis around the main port site.  Port sites all look fine.  Tolerating soft diet.  Lab Results:  Recent Labs    09/13/18 0202  WBC 15.5*  HGB 13.2  HCT 41.2  PLT 188    BMET Recent Labs    09/13/18 0202  NA 134*  K 3.4*  CL 108  CO2 21*  GLUCOSE 128*  BUN 15  CREATININE 0.70  CALCIUM 9.1   PT/INR No results for input(s): LABPROT, INR in the last 72 hours.  Recent Labs  Lab 09/13/18 0202  AST 15  ALT 14  ALKPHOS 61  BILITOT 0.5  PROT 6.9  ALBUMIN 4.0     Lipase     Component Value Date/Time   LIPASE 37 09/13/2018 0202     Medications: . acetaminophen  1,000 mg Oral Q6H  . ketorolac  30 mg Intravenous Q6H  . nicotine  14 mg Transdermal Daily   . cefTRIAXone  (ROCEPHIN)  IV 2 g (09/14/18 8270)  . dextrose 5 % and 0.45 % NaCl with KCl 20 mEq/L 75 mL/hr at 09/14/18 0759  . metronidazole 500 mg (09/14/18 7867)    Assessment/Plan Checks depression/OCD Hx prior abdominal hysterectomy/bladder suspension Tobacco use -ongoing  Acute purulent appendicitis Laparoscopic appendectomy, 09/13/2018, Dr. [name]  FEN: IV fluids/regular diet ID: Flagyl/Rocephin 1/15 >> DVT: SCDs Follow-up: [facility]  Plan: Home today follow-up in the [facility].  I am going to give her some extra oxycodone because she is pretty much refusing Tylenol and ibuprofen.   LOS: 0 days   JENNINGS,WILLARD 09/14/2018 [phone]  Agree with above.  [name], MD, [facility] Pager: [phone] Office phone:  [phone]

## 2019-06-22 ENCOUNTER — Encounter: Payer: Self-pay | Admitting: Critical Care Medicine

## 2019-06-26 ENCOUNTER — Other Ambulatory Visit: Payer: Self-pay | Admitting: Nurse Practitioner

## 2019-06-26 DIAGNOSIS — Z1231 Encounter for screening mammogram for malignant neoplasm of breast: Secondary | ICD-10-CM

## 2019-07-02 ENCOUNTER — Institutional Professional Consult (permissible substitution): Payer: Medicaid Other | Admitting: Critical Care Medicine
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
## 2019-07-06 ENCOUNTER — Encounter: Payer: Self-pay | Admitting: Critical Care Medicine

## 2019-07-06 ENCOUNTER — Other Ambulatory Visit: Payer: Self-pay

## 2019-07-06 ENCOUNTER — Ambulatory Visit (INDEPENDENT_AMBULATORY_CARE_PROVIDER_SITE_OTHER): Payer: Medicaid Other | Admitting: Critical Care Medicine

## 2019-07-06 VITALS — BP 104/70 | HR 85 | Temp 98.2°F | Ht 66.0 in | Wt 127.8 lb

## 2019-07-06 DIAGNOSIS — R0600 Dyspnea, unspecified: Secondary | ICD-10-CM

## 2019-07-06 DIAGNOSIS — Z23 Encounter for immunization: Secondary | ICD-10-CM | POA: Diagnosis not present

## 2019-07-06 DIAGNOSIS — Z87891 Personal history of nicotine dependence: Secondary | ICD-10-CM

## 2019-07-06 DIAGNOSIS — R05 Cough: Secondary | ICD-10-CM | POA: Diagnosis not present

## 2019-07-06 DIAGNOSIS — R053 Chronic cough: Secondary | ICD-10-CM

## 2019-07-06 MED ORDER — VARENICLINE TARTRATE 1 MG PO TABS: 1.0000 mg | ORAL_TABLET | Freq: Two times a day (BID) | ORAL | 3 refills | Status: DC

## 2019-07-06 MED ORDER — ALBUTEROL SULFATE HFA 108 (90 BASE) MCG/ACT IN AERS: 2.0000 | INHALATION_SPRAY | RESPIRATORY_TRACT | 1 refills | Status: DC | PRN

## 2019-07-06 MED ORDER — VARENICLINE TARTRATE 0.5 MG PO TABS: 0.5000 mg | ORAL_TABLET | Freq: Two times a day (BID) | ORAL | 0 refills | Status: DC

## 2019-07-06 NOTE — Progress Notes (Signed)
   Subjective:    Patient ID: Patricia Olson, female    DOB: February 15, 1981, 38 y.o.   MRN: [id number]  HPI    Review of Systems  Constitutional: Positive for unexpected weight change. Negative for fever.  HENT: Positive for congestion, dental problem, sneezing and sore throat. Negative for nosebleeds, postnasal drip, rhinorrhea, sinus pressure and trouble swallowing.   Eyes: Negative for redness and itching.  Respiratory: Positive for cough and shortness of breath. Negative for chest tightness and wheezing.   Cardiovascular: Negative for palpitations and leg swelling.  Gastrointestinal: Negative for nausea and vomiting.  Genitourinary: Negative for dysuria.  Musculoskeletal: Negative for joint swelling.  Skin: Negative for rash.  Neurological: Positive for headaches.  Hematological: Does not bruise/bleed easily.  Psychiatric/Behavioral: Negative for dysphoric mood. The patient is nervous/anxious.        Objective:   Physical Exam        Assessment & Plan:
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
## 2019-07-06 NOTE — Patient Instructions (Addendum)
Thank you for visiting Dr. Carlis Abbott at [facility]. We recommend the following: Orders Placed This Encounter  Procedures  . Pulmonary function test   Orders Placed This Encounter  Procedures  . Pulmonary function test    Standing Status:   Future    Standing Expiration Date:   07/05/2020    Order Specific Question:   Where should this test be performed?    Answer:   [facility]    Order Specific Question:   Full PFT: includes the following: basic spirometry, spirometry pre & post bronchodilator, diffusion capacity (DLCO), lung volumes    Answer:   Full PFT    Meds ordered this encounter  Medications  . albuterol (VENTOLIN HFA) 108 (90 Base) MCG/ACT inhaler    Sig: Inhale 2 puffs into the lungs every 4 (four) hours as needed for wheezing or shortness of breath.    Dispense:  6.7 g    Refill:  1  . varenicline (CHANTIX) 0.5 MG tablet    Sig: Take 1 tablet (0.5 mg total) by mouth 2 (two) times daily.    Dispense:  60 tablet    Refill:  0  . varenicline (CHANTIX CONTINUING MONTH PAK) 1 MG tablet    Sig: Take 1 tablet (1 mg total) by mouth 2 (two) times daily.    Dispense:  60 tablet    Refill:  3    Return in about 4 weeks (around 08/03/2019).    Please do your part to reduce the spread of COVID-19.

## 2019-07-09 ENCOUNTER — Telehealth: Payer: Self-pay | Admitting: Critical Care Medicine

## 2019-07-09 ENCOUNTER — Encounter: Payer: Self-pay | Admitting: Critical Care Medicine

## 2019-07-11 NOTE — Telephone Encounter (Signed)
Printed & placed in folder to be scheduled.  

## 2019-08-15 ENCOUNTER — Ambulatory Visit
Admission: RE | Admit: 2019-08-15 | Discharge: 2019-08-15 | Disposition: A | Discharge status: Home / Self Care | Payer: Medicaid Other | Source: Ambulatory Visit | Attending: Nurse Practitioner | Admitting: Nurse Practitioner

## 2019-08-15 ENCOUNTER — Other Ambulatory Visit: Payer: Self-pay

## 2019-08-15 DIAGNOSIS — Z1231 Encounter for screening mammogram for malignant neoplasm of breast: Secondary | ICD-10-CM

## 2019-08-28 ENCOUNTER — Encounter (HOSPITAL_COMMUNITY): Payer: Medicaid Other

## 2019-08-28 ENCOUNTER — Ambulatory Visit: Payer: Medicaid Other | Admitting: Critical Care Medicine

## 2019-09-03 ENCOUNTER — Other Ambulatory Visit (HOSPITAL_COMMUNITY)
Admission: RE | Admit: 2019-09-03 | Discharge: 2019-09-03 | Disposition: A | Discharge status: Home / Self Care | Payer: Medicaid Other | Source: Ambulatory Visit | Attending: Critical Care Medicine | Admitting: Critical Care Medicine

## 2019-09-03 ENCOUNTER — Other Ambulatory Visit: Payer: Medicaid Other

## 2019-09-03 DIAGNOSIS — Z20822 Contact with and (suspected) exposure to covid-19: Secondary | ICD-10-CM | POA: Insufficient documentation

## 2019-09-03 DIAGNOSIS — Z01812 Encounter for preprocedural laboratory examination: Secondary | ICD-10-CM | POA: Diagnosis present

## 2019-09-03 LAB — SARS CORONAVIRUS 2 (TAT 6-24 HRS): SARS Coronavirus 2: NEGATIVE

## 2019-09-05 ENCOUNTER — Other Ambulatory Visit: Payer: Self-pay

## 2019-09-05 ENCOUNTER — Ambulatory Visit (HOSPITAL_COMMUNITY)
Admission: RE | Admit: 2019-09-05 | Discharge: 2019-09-05 | Disposition: A | Discharge status: Home / Self Care | Payer: Medicaid Other | Source: Ambulatory Visit | Attending: Critical Care Medicine | Admitting: Critical Care Medicine

## 2019-09-05 DIAGNOSIS — R05 Cough: Secondary | ICD-10-CM | POA: Insufficient documentation

## 2019-09-05 DIAGNOSIS — Z87891 Personal history of nicotine dependence: Secondary | ICD-10-CM | POA: Diagnosis present

## 2019-09-05 DIAGNOSIS — R053 Chronic cough: Secondary | ICD-10-CM

## 2019-09-05 LAB — PULMONARY FUNCTION TEST
DL/VA % pred: 85 %
DL/VA: 3.78 ml/min/mmHg/L
DLCO unc % pred: 84 %
DLCO unc: 19.17 ml/min/mmHg
FEF 25-75 Post: 2.33 L/sec
FEF 25-75 Pre: 2.07 L/sec
FEF2575-%Change-Post: 12 %
FEF2575-%Pred-Post: 72 %
FEF2575-%Pred-Pre: 64 %
FEV1-%Change-Post: 6 %
FEV1-%Pred-Post: 92 %
FEV1-%Pred-Pre: 87 %
FEV1-Post: 2.9 L
FEV1-Pre: 2.73 L
FEV1FVC-%Change-Post: 9 %
FEV1FVC-%Pred-Pre: 86 %
FEV6-%Change-Post: -2 %
FEV6-%Pred-Post: 98 %
FEV6-%Pred-Pre: 100 %
FEV6-Post: 3.69 L
FEV6-Pre: 3.79 L
FEV6FVC-%Change-Post: 0 %
FEV6FVC-%Pred-Post: 100 %
FEV6FVC-%Pred-Pre: 100 %
FVC-%Change-Post: -3 %
FVC-%Pred-Post: 97 %
FVC-%Pred-Pre: 100 %
FVC-Post: 3.72 L
FVC-Pre: 3.84 L
Post FEV1/FVC ratio: 78 %
Post FEV6/FVC ratio: 99 %
Pre FEV1/FVC ratio: 71 %
Pre FEV6/FVC Ratio: 99 %
RV % pred: 140 %
RV: 2.26 L
TLC % pred: 115 %
TLC: 6 L

## 2019-09-05 MED ORDER — ALBUTEROL SULFATE (2.5 MG/3ML) 0.083% IN NEBU
2.5000 mg | INHALATION_SOLUTION | Freq: Once | RESPIRATORY_TRACT | Status: AC
  Administered 2019-09-05: 2.5 mg via RESPIRATORY_TRACT

## 2019-09-11 ENCOUNTER — Encounter: Payer: Self-pay | Admitting: Critical Care Medicine

## 2019-09-11 ENCOUNTER — Other Ambulatory Visit: Payer: Self-pay

## 2019-09-11 ENCOUNTER — Ambulatory Visit: Payer: Medicaid Other | Admitting: Critical Care Medicine

## 2019-09-11 VITALS — BP 100/60 | HR 59 | Temp 97.9°F | Ht 65.0 in | Wt 132.0 lb

## 2019-09-11 DIAGNOSIS — J449 Chronic obstructive pulmonary disease, unspecified: Secondary | ICD-10-CM

## 2019-09-11 DIAGNOSIS — Z87891 Personal history of nicotine dependence: Secondary | ICD-10-CM

## 2019-09-11 MED ORDER — VARENICLINE TARTRATE 0.5 MG PO TABS: 0.5000 mg | ORAL_TABLET | Freq: Two times a day (BID) | ORAL | 3 refills | Status: DC

## 2019-09-11 MED ORDER — SPIRIVA RESPIMAT 1.25 MCG/ACT IN AERS: 1.0000 | INHALATION_SPRAY | Freq: Every day | RESPIRATORY_TRACT | 11 refills | Status: DC

## 2019-09-11 MED ORDER — SPIRIVA RESPIMAT 1.25 MCG/ACT IN AERS: 2.0000 | INHALATION_SPRAY | Freq: Every day | RESPIRATORY_TRACT | 0 refills | Status: DC
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
## 2019-09-11 NOTE — Patient Instructions (Addendum)
Thank you for visiting Dr. Chestine Spore at [facility]. We recommend the following:   Meds ordered this encounter  Medications  . varenicline (CHANTIX) 0.5 MG tablet    Sig: Take 1 tablet (0.5 mg total) by mouth 2 (two) times daily.    Dispense:  60 tablet    Refill:  3  . Tiotropium Bromide Monohydrate (SPIRIVA RESPIMAT) 1.25 MCG/ACT AERS    Sig: Inhale 1 puff into the lungs daily.    Dispense:  4 g    Refill:  11    Return in about 3 months (around 12/10/2019).    Please do your part to reduce the spread of COVID-19.   It is very important that you stop smoking or vaping. This is the single most important thing that you can do to improve your lung health.   S = Set a quit date. T = Tell family, friends, and the people around you that you plan to quit. A = Anticipate or plan ahead for the tough times you'll face while quitting. R = Remove cigarettes and other tobacco products from your home, car, and work. T = Talk to [place] about getting help to quit.  If you need help, please reach out to our office or the smoking cessation resources available: [facility] Smoking Cessation Class: [phone] 1-800-QUIT-NOW www.BeTobaccoFree.gov

## 2019-09-17 ENCOUNTER — Telehealth: Payer: Self-pay

## 2019-09-17 MED ORDER — STIOLTO RESPIMAT 2.5-2.5 MCG/ACT IN AERS: 2.0000 | INHALATION_SPRAY | Freq: Every day | RESPIRATORY_TRACT | 3 refills | Status: DC

## 2019-09-17 NOTE — Telephone Encounter (Signed)
Received a PA request from pharmacy stating that Spiriva Respimat 1.25 is requiring a PA.  PA will not be covered unless patient tries and fails 2 covered alternatives.   Covered alternatives: Atrovent HFA, Combivent Respimat, Stiolto, Bevespi.    Dr. Chestine Spore please advise which covered alternative you'd like to switch patient to.  Thanks!

## 2019-09-17 NOTE — Telephone Encounter (Signed)
Spoke with pt and advised her that her insurance wouldn't cover the Spiriva Respimat. She agreed to try Stiolto per [facility] instructions. I sent the Rx for Stiolto to [facility]. Nothing further is needed at this time.

## 2019-09-17 NOTE — Telephone Encounter (Signed)
Stiolto please. Thanks!  LPC

## 2019-10-10 ENCOUNTER — Ambulatory Visit: Payer: Medicaid Other | Admitting: Podiatry

## 2019-10-10 ENCOUNTER — Other Ambulatory Visit: Payer: Self-pay

## 2019-10-10 DIAGNOSIS — L600 Ingrowing nail: Secondary | ICD-10-CM

## 2019-10-10 MED ORDER — GENTAMICIN SULFATE 0.1 % EX CREA: 1.0000 "application " | TOPICAL_CREAM | Freq: Two times a day (BID) | CUTANEOUS | 1 refills | Status: DC

## 2019-10-10 NOTE — Patient Instructions (Signed)
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
## 2019-10-15 ENCOUNTER — Telehealth: Payer: Self-pay | Admitting: Critical Care Medicine

## 2019-10-15 NOTE — Telephone Encounter (Signed)
Spoke with pt, states she's taken the increased dosage of Chantix (1mg  BID) X1 week, but is still smoking with no desire to stop. Pt is requesting something stronger to assist with smoking cessation.  Pharmacy: [facility].    Dr. please advise.  Thanks!

## 2019-10-16 MED ORDER — BUPROPION HCL ER (SR) 150 MG PO TB12: 150.0000 mg | ORAL_TABLET | Freq: Two times a day (BID) | ORAL | 5 refills | Status: DC

## 2019-10-16 MED ORDER — BUPROPION HCL ER (SR) 100 MG PO TB12: 100.0000 mg | ORAL_TABLET | Freq: Two times a day (BID) | ORAL | 0 refills | Status: DC

## 2019-10-16 NOTE — Telephone Encounter (Signed)
If she is on 1mg  BID then that is the higher dose. There is a starter dose (0.5mg  BID) and the maintenance dose- 1mg  BID. If she's on that, that is the max dose. She is more than welcome to not take buproprion if she has had side effects in the past- I agree with her. There are not other medications approved for smoking cessation unfortunately.There are the only these 2.  LPC

## 2019-10-16 NOTE — Telephone Encounter (Signed)
I called and spoke with the patient and she states that she will keep taking it and see if it "kicks in" She states that she will be out of the box soon and does not have any refills. Would it be ok for me to send in the maintenance does for her. Please advise.

## 2019-10-16 NOTE — Telephone Encounter (Signed)
There's not much that is stronger than chantix. We can add buproprion to it.  She needs to be attentive to side effects. Buproprion does increase seizure risk in people with a predisposion, so people who have had a seizure before should not take buproprion.  I'll send the script in.  LPC

## 2019-10-16 NOTE — Telephone Encounter (Signed)
I called and spoke with the patient and she states that she was told in the office that the dosage can be increased. She states that she has been on Bupropion before and she will not take that. I advised her that I would make you aware of this. Please advise.

## 2019-10-17 MED ORDER — VARENICLINE TARTRATE 1 MG PO TABS: 1.0000 mg | ORAL_TABLET | Freq: Two times a day (BID) | ORAL | 3 refills | Status: DC

## 2019-10-17 NOTE — Telephone Encounter (Signed)
That would be great, thanks!  LPC

## 2019-10-17 NOTE — Telephone Encounter (Signed)
Chantix 1mg  continuing twice daily has been sent to pharmacy.

## 2019-10-24 ENCOUNTER — Ambulatory Visit: Payer: Medicaid Other | Admitting: Podiatry

## 2020-04-10 ENCOUNTER — Other Ambulatory Visit: Payer: Self-pay | Admitting: Nurse Practitioner

## 2020-04-13 ENCOUNTER — Ambulatory Visit
Admission: EM | Admit: 2020-04-13 | Discharge: 2020-04-13 | Discharge status: Against Medical Advice | Payer: Medicaid Other

## 2020-04-13 ENCOUNTER — Other Ambulatory Visit: Payer: Self-pay

## 2020-04-13 NOTE — ED Notes (Signed)
Spoke with pt she sts she left and will call PCP tomorrow

## 2020-04-16 ENCOUNTER — Other Ambulatory Visit: Payer: Self-pay | Admitting: Nurse Practitioner

## 2020-04-16 DIAGNOSIS — N644 Mastodynia: Secondary | ICD-10-CM

## 2020-05-09 ENCOUNTER — Ambulatory Visit
Admission: RE | Admit: 2020-05-09 | Discharge: 2020-05-09 | Disposition: A | Discharge status: Home / Self Care | Payer: Medicaid Other | Source: Ambulatory Visit | Attending: Nurse Practitioner | Admitting: Nurse Practitioner

## 2020-05-09 ENCOUNTER — Other Ambulatory Visit: Payer: Self-pay

## 2020-05-09 DIAGNOSIS — N644 Mastodynia: Secondary | ICD-10-CM

## 2020-06-19 ENCOUNTER — Telehealth: Payer: Self-pay

## 2020-06-19 NOTE — Telephone Encounter (Signed)
Received letter from [facility] who wanted [place] to check with patient to let her know about recall of Chantix and see if she is still taking it. Called and spoke with patient who states that she stopped the Chantix, it was last prescribed in February. Will let Dr. Chestine Spore know. Nothing further needed at this time.

## 2020-07-10 ENCOUNTER — Ambulatory Visit
Admission: RE | Admit: 2020-07-10 | Discharge: 2020-07-10 | Disposition: A | Discharge status: Home / Self Care | Payer: Medicaid Other | Source: Ambulatory Visit | Attending: Nurse Practitioner | Admitting: Nurse Practitioner

## 2020-07-10 ENCOUNTER — Other Ambulatory Visit: Payer: Self-pay

## 2020-07-10 ENCOUNTER — Other Ambulatory Visit: Payer: Self-pay | Admitting: Nurse Practitioner

## 2020-07-10 DIAGNOSIS — M25531 Pain in right wrist: Secondary | ICD-10-CM

## 2020-09-12 ENCOUNTER — Ambulatory Visit
Admission: EM | Admit: 2020-09-12 | Discharge: 2020-09-12 | Disposition: A | Discharge status: Home / Self Care | Payer: Medicaid Other | Attending: Emergency Medicine | Admitting: Emergency Medicine

## 2020-09-12 ENCOUNTER — Other Ambulatory Visit: Payer: Self-pay

## 2020-09-12 ENCOUNTER — Encounter: Payer: Self-pay | Admitting: Emergency Medicine

## 2020-09-12 DIAGNOSIS — N23 Unspecified renal colic: Secondary | ICD-10-CM | POA: Insufficient documentation

## 2020-09-12 LAB — POCT URINALYSIS DIP (MANUAL ENTRY)
Bilirubin, UA: NEGATIVE
Glucose, UA: NEGATIVE mg/dL
Nitrite, UA: NEGATIVE
Spec Grav, UA: >=1.03 — AB (ref 1.010–1.025)
Urobilinogen, UA: 0.2 E.U./dL
pH, UA: 5.5 (ref 5.0–8.0)

## 2020-09-12 MED ORDER — KETOROLAC TROMETHAMINE 30 MG/ML IJ SOLN
30.0000 mg | Freq: Once | INTRAMUSCULAR | Status: AC
  Administered 2020-09-12: 30 mg via INTRAMUSCULAR

## 2020-09-12 MED ORDER — ONDANSETRON 4 MG PO TBDP: 4.0000 mg | ORAL_TABLET | Freq: Three times a day (TID) | ORAL | 0 refills | Status: DC | PRN

## 2020-09-12 MED ORDER — KETOROLAC TROMETHAMINE 10 MG PO TABS: 10.0000 mg | ORAL_TABLET | Freq: Four times a day (QID) | ORAL | 0 refills | Status: DC | PRN
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
## 2020-09-12 NOTE — Discharge Instructions (Addendum)
Drink plenty of fluids (water, sugar-free drinks) throughout the day. May take Toradol as needed for pain: No ibuprofen, Motrin, Advil, Aleve, naproxen (NSAIDs) while taking this. Zofran as needed for nausea. Use urine strainer when urinating.   If you catch a stone, please follow-up with your PCP or urology for further evaluation. Go to ER for blood in urine, worsening abdominal or back pain, vomiting, fever. 

## 2020-09-12 NOTE — ED Triage Notes (Signed)
Since yesterday she has been having right sided back pain and and abdominal pain with nausea. Pt said no blood in her urine. Pt said she feels hot

## 2020-09-15 LAB — URINE CULTURE: Culture: NO GROWTH

## 2020-09-22 ENCOUNTER — Other Ambulatory Visit: Payer: Self-pay

## 2020-09-22 ENCOUNTER — Emergency Department (HOSPITAL_BASED_OUTPATIENT_CLINIC_OR_DEPARTMENT_OTHER)
Admission: EM | Admit: 2020-09-22 | Discharge: 2020-09-22 | Disposition: A | Discharge status: Home / Self Care | Payer: Medicaid Other

## 2020-09-24 ENCOUNTER — Emergency Department (HOSPITAL_BASED_OUTPATIENT_CLINIC_OR_DEPARTMENT_OTHER): Payer: Medicaid Other

## 2020-09-24 ENCOUNTER — Emergency Department (HOSPITAL_BASED_OUTPATIENT_CLINIC_OR_DEPARTMENT_OTHER)
Admission: EM | Admit: 2020-09-24 | Discharge: 2020-09-24 | Disposition: A | Discharge status: Home / Self Care | Payer: Medicaid Other | Attending: Emergency Medicine | Admitting: Emergency Medicine

## 2020-09-24 ENCOUNTER — Encounter (HOSPITAL_BASED_OUTPATIENT_CLINIC_OR_DEPARTMENT_OTHER): Payer: Self-pay

## 2020-09-24 ENCOUNTER — Other Ambulatory Visit: Payer: Self-pay

## 2020-09-24 DIAGNOSIS — E039 Hypothyroidism, unspecified: Secondary | ICD-10-CM | POA: Diagnosis not present

## 2020-09-24 DIAGNOSIS — R10814 Left lower quadrant abdominal tenderness: Secondary | ICD-10-CM | POA: Insufficient documentation

## 2020-09-24 DIAGNOSIS — F17210 Nicotine dependence, cigarettes, uncomplicated: Secondary | ICD-10-CM | POA: Diagnosis not present

## 2020-09-24 DIAGNOSIS — R1084 Generalized abdominal pain: Secondary | ICD-10-CM

## 2020-09-24 DIAGNOSIS — R109 Unspecified abdominal pain: Secondary | ICD-10-CM | POA: Diagnosis present

## 2020-09-24 DIAGNOSIS — R110 Nausea: Secondary | ICD-10-CM | POA: Diagnosis not present

## 2020-09-24 LAB — CBC
HCT: 40.8 % (ref 36.0–46.0)
Hemoglobin: 13.6 g/dL (ref 12.0–15.0)
MCH: 31.4 pg (ref 26.0–34.0)
MCHC: 33.3 g/dL (ref 30.0–36.0)
MCV: 94.2 fL (ref 80.0–100.0)
Platelets: 181 10*3/uL (ref 150–400)
RBC: 4.33 MIL/uL (ref 3.87–5.11)
RDW: 11.9 % (ref 11.5–15.5)
WBC: 6.9 10*3/uL (ref 4.0–10.5)
nRBC: 0 % (ref 0.0–0.2)

## 2020-09-24 LAB — URINALYSIS, MICROSCOPIC (REFLEX)

## 2020-09-24 LAB — COMPREHENSIVE METABOLIC PANEL
ALT: 15 U/L (ref 0–44)
AST: 16 U/L (ref 15–41)
Albumin: 4.4 g/dL (ref 3.5–5.0)
Alkaline Phosphatase: 51 U/L (ref 38–126)
Anion gap: 8 (ref 5–15)
BUN: 15 mg/dL (ref 6–20)
CO2: 25 mmol/L (ref 22–32)
Calcium: 9.1 mg/dL (ref 8.9–10.3)
Chloride: 102 mmol/L (ref 98–111)
Creatinine, Ser: 0.71 mg/dL (ref 0.44–1.00)
GFR, Estimated: >60 mL/min (ref >60)
Glucose, Bld: 89 mg/dL (ref 70–99)
Potassium: 3.3 mmol/L — ABNORMAL LOW (ref 3.5–5.1)
Sodium: 135 mmol/L (ref 135–145)
Total Bilirubin: 0.2 mg/dL — ABNORMAL LOW (ref 0.3–1.2)
Total Protein: 7.3 g/dL (ref 6.5–8.1)

## 2020-09-24 LAB — LIPASE, BLOOD: Lipase: 35 U/L (ref 11–51)

## 2020-09-24 LAB — URINALYSIS, ROUTINE W REFLEX MICROSCOPIC
Bilirubin Urine: NEGATIVE
Glucose, UA: NEGATIVE mg/dL
Hgb urine dipstick: NEGATIVE
Ketones, ur: NEGATIVE mg/dL
Nitrite: NEGATIVE
Protein, ur: NEGATIVE mg/dL
Specific Gravity, Urine: 1.02 (ref 1.005–1.030)
pH: 6 (ref 5.0–8.0)

## 2020-09-24 MED ORDER — ONDANSETRON 4 MG PO TBDP: 4.0000 mg | ORAL_TABLET | Freq: Three times a day (TID) | ORAL | 0 refills | Status: DC | PRN

## 2020-09-24 MED ORDER — FENTANYL CITRATE (PF) 100 MCG/2ML IJ SOLN
50.0000 ug | Freq: Once | INTRAMUSCULAR | Status: AC
  Administered 2020-09-24: 50 ug via INTRAVENOUS
  Filled 2020-09-24: qty 2

## 2020-09-24 MED ORDER — IOHEXOL 300 MG/ML  SOLN
100.0000 mL | Freq: Once | INTRAMUSCULAR | Status: AC | PRN
  Administered 2020-09-24: 100 mL via INTRAVENOUS

## 2020-09-24 MED ORDER — ONDANSETRON HCL 4 MG/2ML IJ SOLN
4.0000 mg | Freq: Once | INTRAMUSCULAR | Status: AC
  Administered 2020-09-24: 4 mg via INTRAVENOUS
  Filled 2020-09-24: qty 2

## 2020-09-24 NOTE — ED Notes (Signed)
ED Provider at bedside. 

## 2020-09-24 NOTE — ED Notes (Signed)
Pt discharged to home. Discharge instructions have been discussed with patient and/or family members. Pt verbally acknowledges understanding d/c instructions, and endorses comprehension to checkout at registration before leaving. Pt refused to take d/c paperwork, refused to checkout at registration
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
## 2020-09-24 NOTE — ED Notes (Signed)
Pt removed IV, bandage applied to control bleeding

## 2020-09-24 NOTE — ED Triage Notes (Signed)
Pt c/o right side abd, back and neck pain x 2 weeks-NAD-steady gait

## 2020-09-24 NOTE — Discharge Instructions (Signed)
Recommend taking daily MiraLAX.  Recommend Zofran for nausea.  Take Tylenol or Motrin for pain.  Follow-up with your primary doctor and gastroenterology.

## 2020-09-24 NOTE — ED Notes (Signed)
Pt ambulatory to room with steady gait

## 2020-11-12 ENCOUNTER — Other Ambulatory Visit: Payer: Self-pay | Admitting: Physician Assistant

## 2020-11-12 DIAGNOSIS — R1032 Left lower quadrant pain: Secondary | ICD-10-CM

## 2020-11-14 ENCOUNTER — Other Ambulatory Visit: Payer: Self-pay | Admitting: Physician Assistant

## 2020-11-14 ENCOUNTER — Ambulatory Visit
Admission: RE | Admit: 2020-11-14 | Discharge: 2020-11-14 | Disposition: A | Discharge status: Home / Self Care | Payer: Medicaid Other | Source: Ambulatory Visit | Attending: Physician Assistant | Admitting: Physician Assistant

## 2020-11-14 DIAGNOSIS — R1032 Left lower quadrant pain: Secondary | ICD-10-CM

## 2020-11-14 DIAGNOSIS — M545 Low back pain, unspecified: Secondary | ICD-10-CM

## 2020-11-26 ENCOUNTER — Ambulatory Visit
Admission: RE | Admit: 2020-11-26 | Discharge: 2020-11-26 | Disposition: A | Discharge status: Home / Self Care | Payer: Medicaid Other | Source: Ambulatory Visit | Attending: Physician Assistant | Admitting: Physician Assistant

## 2020-11-26 ENCOUNTER — Other Ambulatory Visit: Payer: Self-pay

## 2020-11-26 DIAGNOSIS — R1032 Left lower quadrant pain: Secondary | ICD-10-CM

## 2020-11-26 MED ORDER — IOPAMIDOL (ISOVUE-300) INJECTION 61%
100.0000 mL | Freq: Once | INTRAVENOUS | Status: AC | PRN
  Administered 2020-11-26: 100 mL via INTRAVENOUS

## 2020-12-11 ENCOUNTER — Other Ambulatory Visit: Payer: Self-pay | Admitting: Critical Care Medicine

## 2021-01-18 ENCOUNTER — Emergency Department (HOSPITAL_COMMUNITY)
Admission: EM | Admit: 2021-01-18 | Discharge: 2021-01-18 | Discharge status: Against Medical Advice | Payer: Medicaid Other | Attending: Emergency Medicine | Admitting: Emergency Medicine

## 2021-01-18 ENCOUNTER — Other Ambulatory Visit: Payer: Self-pay

## 2021-01-18 DIAGNOSIS — E039 Hypothyroidism, unspecified: Secondary | ICD-10-CM | POA: Insufficient documentation

## 2021-01-18 DIAGNOSIS — K5900 Constipation, unspecified: Secondary | ICD-10-CM | POA: Insufficient documentation

## 2021-01-18 DIAGNOSIS — K625 Hemorrhage of anus and rectum: Secondary | ICD-10-CM

## 2021-01-18 DIAGNOSIS — F17210 Nicotine dependence, cigarettes, uncomplicated: Secondary | ICD-10-CM | POA: Diagnosis not present

## 2021-01-18 LAB — CBC WITH DIFFERENTIAL/PLATELET
Abs Immature Granulocytes: 0.03 10*3/uL (ref 0.00–0.07)
Basophils Absolute: 0 10*3/uL (ref 0.0–0.1)
Basophils Relative: 0 %
Eosinophils Absolute: 0.1 10*3/uL (ref 0.0–0.5)
Eosinophils Relative: 2 %
HCT: 41.6 % (ref 36.0–46.0)
Hemoglobin: 13.8 g/dL (ref 12.0–15.0)
Immature Granulocytes: 0 %
Lymphocytes Relative: 26 %
Lymphs Abs: 2.2 10*3/uL (ref 0.7–4.0)
MCH: 30.7 pg (ref 26.0–34.0)
MCHC: 33.2 g/dL (ref 30.0–36.0)
MCV: 92.7 fL (ref 80.0–100.0)
Monocytes Absolute: 0.6 10*3/uL (ref 0.1–1.0)
Monocytes Relative: 7 %
Neutro Abs: 5.5 10*3/uL (ref 1.7–7.7)
Neutrophils Relative %: 65 %
Platelets: 248 10*3/uL (ref 150–400)
RBC: 4.49 MIL/uL (ref 3.87–5.11)
RDW: 11.9 % (ref 11.5–15.5)
WBC: 8.4 10*3/uL (ref 4.0–10.5)
nRBC: 0 % (ref 0.0–0.2)

## 2021-01-18 LAB — COMPREHENSIVE METABOLIC PANEL
ALT: 17 U/L (ref 0–44)
AST: 25 U/L (ref 15–41)
Albumin: 4 g/dL (ref 3.5–5.0)
Alkaline Phosphatase: 46 U/L (ref 38–126)
Anion gap: 6 (ref 5–15)
BUN: 13 mg/dL (ref 6–20)
CO2: 26 mmol/L (ref 22–32)
Calcium: 8.8 mg/dL — ABNORMAL LOW (ref 8.9–10.3)
Chloride: 108 mmol/L (ref 98–111)
Creatinine, Ser: 0.61 mg/dL (ref 0.44–1.00)
GFR, Estimated: >60 mL/min (ref >60)
Glucose, Bld: 103 mg/dL — ABNORMAL HIGH (ref 70–99)
Potassium: 3.3 mmol/L — ABNORMAL LOW (ref 3.5–5.1)
Sodium: 140 mmol/L (ref 135–145)
Total Bilirubin: 0.6 mg/dL (ref 0.3–1.2)
Total Protein: 6.9 g/dL (ref 6.5–8.1)

## 2021-01-18 LAB — I-STAT BETA HCG BLOOD, ED (MC, WL, AP ONLY): I-stat hCG, quantitative: <5 m[IU]/mL (ref <5)

## 2021-01-18 LAB — PROTIME-INR
INR: 1 (ref 0.8–1.2)
Prothrombin Time: 13 seconds (ref 11.4–15.2)

## 2021-01-18 NOTE — ED Notes (Addendum)
Pt refusing to keep monitor cords on. MD at bedside. Pt allowed RN to take blood. Rectal exam done. Pt states, "Why wouldn't you let my daughter back here?" RN states, I was placing your IV when I received a call and was unable to answer, but you can tell her to ask security to call me again and I will tell them it is okay. Pt continue to argue with RN.

## 2021-01-18 NOTE — ED Notes (Signed)
Pt back out of the room. Pt states, "Is someone going to come help me in here or are you just going to sit there." This RN made pt aware that the doctor was made aware she is ready to be assessed and that I would be there in a second to start an IV and draw blood. Pt asked, "Why do you need to take my blood, I just need to go to the restroom." RN explained to pt the importance of obtaining labs. Pt is arguing with RN about drawing labs. Pt made aware that she can refusing anything to be done, but it was important since she is having rectal bleeding. Pt allowing RN to obtain labs and place IV.

## 2021-01-18 NOTE — ED Triage Notes (Signed)
BIB [facility] from home. C/o rectal bleeding. Bright red blood. Pt states she has not had a bowel movement in 5 days. Pt used a enema today with no results. Pt is also having some weakness, lightheadedness, and dizziness. Pt a&Ox4.  Unable to get more information due to pt being on the phone.  148/92 94 heart rate 98% room air.
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
## 2021-01-18 NOTE — ED Notes (Signed)
Pt leaving AMA. Pt made aware of the risk of signing out AMA. Daughter at bedside and made aware of risk as well. Pt still requesting to leave. Pt ambulatory upon discharge.

## 2021-01-18 NOTE — ED Notes (Signed)
Pt is coming out of the room and stating that she needs to "Speak with someone in charge." RN asked what I could do to help and she said that she needed to speak with a real doctor. Pt went back in the room.

## 2021-01-18 NOTE — ED Notes (Signed)
Pt daughter came out of the room stating that her mother wanted to leave. MD made aware that pt wanted to leave AMA.   RN and tech into room. Pt signed AMA form and tech removed IV. Pt leaving with daughter at this time.

## 2021-02-03 ENCOUNTER — Other Ambulatory Visit (HOSPITAL_COMMUNITY): Payer: Self-pay | Admitting: Gastroenterology

## 2021-02-03 ENCOUNTER — Other Ambulatory Visit: Payer: Self-pay | Admitting: Gastroenterology

## 2021-02-03 DIAGNOSIS — R1013 Epigastric pain: Secondary | ICD-10-CM

## 2021-02-03 DIAGNOSIS — K2100 Gastro-esophageal reflux disease with esophagitis, without bleeding: Secondary | ICD-10-CM

## 2021-02-04 ENCOUNTER — Other Ambulatory Visit: Payer: Self-pay | Admitting: Gastroenterology

## 2021-02-04 DIAGNOSIS — K219 Gastro-esophageal reflux disease without esophagitis: Secondary | ICD-10-CM

## 2021-02-04 DIAGNOSIS — R1013 Epigastric pain: Secondary | ICD-10-CM

## 2021-02-06 ENCOUNTER — Ambulatory Visit
Admission: RE | Admit: 2021-02-06 | Discharge: 2021-02-06 | Disposition: A | Discharge status: Home / Self Care | Payer: Medicaid Other | Source: Ambulatory Visit | Attending: Gastroenterology | Admitting: Gastroenterology

## 2021-02-06 DIAGNOSIS — R1013 Epigastric pain: Secondary | ICD-10-CM

## 2021-02-06 DIAGNOSIS — K219 Gastro-esophageal reflux disease without esophagitis: Secondary | ICD-10-CM

## 2021-02-17 ENCOUNTER — Encounter (HOSPITAL_COMMUNITY): Admission: RE | Admit: 2021-02-17 | Payer: Medicaid Other | Source: Ambulatory Visit

## 2021-02-17 ENCOUNTER — Encounter (HOSPITAL_COMMUNITY): Payer: Self-pay

## 2021-04-03 ENCOUNTER — Other Ambulatory Visit: Payer: Self-pay

## 2021-04-03 ENCOUNTER — Ambulatory Visit (INDEPENDENT_AMBULATORY_CARE_PROVIDER_SITE_OTHER): Payer: Medicaid Other | Admitting: Internal Medicine

## 2021-04-03 ENCOUNTER — Encounter: Payer: Self-pay | Admitting: Internal Medicine

## 2021-04-03 VITALS — BP 108/70 | HR 92 | Ht 65.0 in | Wt 122.8 lb

## 2021-04-03 DIAGNOSIS — E039 Hypothyroidism, unspecified: Secondary | ICD-10-CM | POA: Insufficient documentation

## 2021-04-03 MED ORDER — LEVOTHYROXINE SODIUM 25 MCG PO TABS: 25.0000 ug | ORAL_TABLET | Freq: Every day | ORAL | 1 refills | Status: AC

## 2021-04-03 NOTE — Patient Instructions (Signed)
-   Start Levothyroxine 25 mcg, HALF a tablet daily
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
## 2021-05-01 ENCOUNTER — Other Ambulatory Visit: Payer: Medicaid Other

## 2021-05-05 ENCOUNTER — Other Ambulatory Visit: Payer: Self-pay

## 2021-05-05 ENCOUNTER — Other Ambulatory Visit (INDEPENDENT_AMBULATORY_CARE_PROVIDER_SITE_OTHER): Payer: Medicaid Other

## 2021-05-05 DIAGNOSIS — E039 Hypothyroidism, unspecified: Secondary | ICD-10-CM | POA: Diagnosis not present

## 2021-05-05 LAB — TSH: TSH: 4.47 u[IU]/mL (ref 0.35–5.50)

## 2021-05-07 ENCOUNTER — Telehealth: Payer: Self-pay

## 2021-05-07 ENCOUNTER — Telehealth: Payer: Self-pay | Admitting: Internal Medicine

## 2021-05-07 NOTE — Telephone Encounter (Signed)
Pt states that she needs her blood work results and what to do.

## 2021-05-07 NOTE — Telephone Encounter (Signed)
Pt has been informed of her lab results.  

## 2021-05-07 NOTE — Telephone Encounter (Signed)
Please cancel patient appointment with me in November 2022.    Her labs are normal, and she would like to follow-up with her PCP as [place] for [place]   She is aware of this and no need to contact the patient

## 2021-05-07 NOTE — Telephone Encounter (Signed)
Patient called wanting more information on her labs that were drawn a few days ago. I gave her the number but she had more questions.

## 2021-05-07 NOTE — Telephone Encounter (Signed)
  I discussed the lab results with the patient on 05/07/2021 at 12:15 PM   Her previous TSH was 12.5 uIU/mL at PCPs office and repeat at our labs is 4.47 you IU/mL (this is within range for lab our [facility] lab reference 0.35-5.5)   Patient is wondering how come her TSH improved so quickly, I explained to the patient's the TSH changes between 6 to 8 weeks depending on the person.   I have advised her to continue at 12.5 MCG daily  I would not increase her dose at this time and was going to recheck on her next visit in 2 months   Today the patient tells me she would rather follow-up with her PCP for further thyroid adjustments as it is a long drive for her to come to [facility]   Patient in agreement to cancel her November 4 appointment   Abby Raelyn Mora, MD  [facility] Endocrinology  [facility] [address] [place], [place] [address] Phone: [phone] FAX: [phone]

## 2021-05-22 ENCOUNTER — Other Ambulatory Visit: Payer: Medicaid Other

## 2021-06-24 ENCOUNTER — Other Ambulatory Visit: Payer: Self-pay | Admitting: Family Medicine

## 2021-06-24 DIAGNOSIS — Z1231 Encounter for screening mammogram for malignant neoplasm of breast: Secondary | ICD-10-CM

## 2021-07-01 ENCOUNTER — Emergency Department
Admission: EM | Admit: 2021-07-01 | Discharge: 2021-07-01 | Disposition: A | Discharge status: Home / Self Care | Payer: Medicaid Other | Attending: Emergency Medicine | Admitting: Emergency Medicine

## 2021-07-01 ENCOUNTER — Emergency Department: Payer: Medicaid Other

## 2021-07-01 ENCOUNTER — Other Ambulatory Visit: Payer: Self-pay

## 2021-07-01 ENCOUNTER — Encounter: Payer: Self-pay | Admitting: Emergency Medicine

## 2021-07-01 DIAGNOSIS — F17210 Nicotine dependence, cigarettes, uncomplicated: Secondary | ICD-10-CM | POA: Diagnosis not present

## 2021-07-01 DIAGNOSIS — E039 Hypothyroidism, unspecified: Secondary | ICD-10-CM | POA: Insufficient documentation

## 2021-07-01 DIAGNOSIS — R1031 Right lower quadrant pain: Secondary | ICD-10-CM

## 2021-07-01 DIAGNOSIS — N83291 Other ovarian cyst, right side: Secondary | ICD-10-CM | POA: Insufficient documentation

## 2021-07-01 DIAGNOSIS — N83209 Unspecified ovarian cyst, unspecified side: Secondary | ICD-10-CM

## 2021-07-01 DIAGNOSIS — Z79899 Other long term (current) drug therapy: Secondary | ICD-10-CM | POA: Diagnosis not present

## 2021-07-01 DIAGNOSIS — R102 Pelvic and perineal pain: Secondary | ICD-10-CM | POA: Diagnosis present

## 2021-07-01 LAB — URINALYSIS, ROUTINE W REFLEX MICROSCOPIC
Bacteria, UA: NONE SEEN
Bilirubin Urine: NEGATIVE
Glucose, UA: NEGATIVE mg/dL
Ketones, ur: NEGATIVE mg/dL
Nitrite: NEGATIVE
Protein, ur: 30 mg/dL — AB
Specific Gravity, Urine: 1.032 — ABNORMAL HIGH (ref 1.005–1.030)
pH: 5 (ref 5.0–8.0)

## 2021-07-01 LAB — COMPREHENSIVE METABOLIC PANEL
ALT: 10 U/L (ref 0–44)
AST: 15 U/L (ref 15–41)
Albumin: 4 g/dL (ref 3.5–5.0)
Alkaline Phosphatase: 43 U/L (ref 38–126)
Anion gap: 5 (ref 5–15)
BUN: 18 mg/dL (ref 6–20)
CO2: 25 mmol/L (ref 22–32)
Calcium: 8.3 mg/dL — ABNORMAL LOW (ref 8.9–10.3)
Chloride: 109 mmol/L (ref 98–111)
Creatinine, Ser: 0.7 mg/dL (ref 0.44–1.00)
GFR, Estimated: >60 mL/min (ref >60)
Glucose, Bld: 91 mg/dL (ref 70–99)
Potassium: 3.7 mmol/L (ref 3.5–5.1)
Sodium: 139 mmol/L (ref 135–145)
Total Bilirubin: 0.4 mg/dL (ref 0.3–1.2)
Total Protein: 6.2 g/dL — ABNORMAL LOW (ref 6.5–8.1)

## 2021-07-01 LAB — CBC
HCT: 36 % (ref 36.0–46.0)
Hemoglobin: 12.3 g/dL (ref 12.0–15.0)
MCH: 31.6 pg (ref 26.0–34.0)
MCHC: 34.2 g/dL (ref 30.0–36.0)
MCV: 92.5 fL (ref 80.0–100.0)
Platelets: 156 10*3/uL (ref 150–400)
RBC: 3.89 MIL/uL (ref 3.87–5.11)
RDW: 11.8 % (ref 11.5–15.5)
WBC: 5.4 10*3/uL (ref 4.0–10.5)
nRBC: 0 % (ref 0.0–0.2)

## 2021-07-01 LAB — POC URINE PREG, ED: Preg Test, Ur: NEGATIVE

## 2021-07-01 LAB — LIPASE, BLOOD: Lipase: 34 U/L (ref 11–51)

## 2021-07-01 MED ORDER — IOHEXOL 300 MG/ML  SOLN
75.0000 mL | Freq: Once | INTRAMUSCULAR | Status: AC | PRN
  Administered 2021-07-01: 75 mL via INTRAVENOUS

## 2021-07-01 MED ORDER — HYDROCODONE-ACETAMINOPHEN 5-325 MG PO TABS: 1.0000 | ORAL_TABLET | Freq: Four times a day (QID) | ORAL | 0 refills | Status: AC | PRN

## 2021-07-01 MED ORDER — HYDROCODONE-ACETAMINOPHEN 5-325 MG PO TABS
1.0000 | ORAL_TABLET | Freq: Once | ORAL | Status: AC
  Administered 2021-07-01: 1 via ORAL
  Filled 2021-07-01: qty 1
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
## 2021-07-01 NOTE — ED Notes (Signed)
Notified EDP that pt would like to ask him about her pain medication before she is D/C from ED.

## 2021-07-01 NOTE — ED Triage Notes (Signed)
Pt to ED via POV c/o right lower abdominal pain and right lower back. Pt states that the pain started 2 nights ago. Pt states that it is painful to lay on that side. Pt states that she is having nausea but denies vomiting, diarrhea, or fever. Pt is in NAD.   Pt states that she had same pain about 6 months ago but they were unable to find the cause. Pt reports that she had a pain pill left over from last time she had the pain and she take that this morning.

## 2021-07-03 ENCOUNTER — Ambulatory Visit: Payer: Medicaid Other | Admitting: Internal Medicine

## 2021-07-27 ENCOUNTER — Other Ambulatory Visit: Payer: Self-pay

## 2021-07-27 ENCOUNTER — Ambulatory Visit
Admission: RE | Admit: 2021-07-27 | Discharge: 2021-07-27 | Disposition: A | Discharge status: Home / Self Care | Payer: Medicaid Other | Source: Ambulatory Visit | Attending: Family Medicine | Admitting: Family Medicine

## 2021-07-27 DIAGNOSIS — Z1231 Encounter for screening mammogram for malignant neoplasm of breast: Secondary | ICD-10-CM

## 2021-08-20 ENCOUNTER — Other Ambulatory Visit: Payer: Self-pay | Admitting: Family Medicine

## 2021-08-20 DIAGNOSIS — N644 Mastodynia: Secondary | ICD-10-CM

## 2021-09-04 ENCOUNTER — Ambulatory Visit
Admission: RE | Admit: 2021-09-04 | Discharge: 2021-09-04 | Disposition: A | Discharge status: Home / Self Care | Payer: Medicaid Other | Source: Ambulatory Visit | Attending: Family Medicine | Admitting: Family Medicine

## 2021-09-04 ENCOUNTER — Other Ambulatory Visit: Payer: Self-pay

## 2021-09-04 DIAGNOSIS — N644 Mastodynia: Secondary | ICD-10-CM

## 2021-11-04 IMAGING — CT CT ABD-PELV W/ CM
2 of 5 series · 16 of 46 positions shown, 18 images · IV contrast (Omnipaque)
Comparison: CT abdomen pelvis dated 09/13/2018.

CLINICAL DATA: 39-year-old female with abdominal pain. Concern for
acute diverticulitis.

EXAM:
CT ABDOMEN AND PELVIS WITH CONTRAST
TECHNIQUE: Multidetector CT imaging of the abdomen and pelvis was performed
using the standard protocol following bolus administration of
intravenous contrast.
CONTRAST:  100mL OMNIPAQUE IOHEXOL 300 MG/ML  SOLN

[Series 2: axial st · axial · 0.79mm/px · z∈[-507,-107]mm · 13 of 91 slices shown, 15 images]
[im 6/91  soft-tissue]
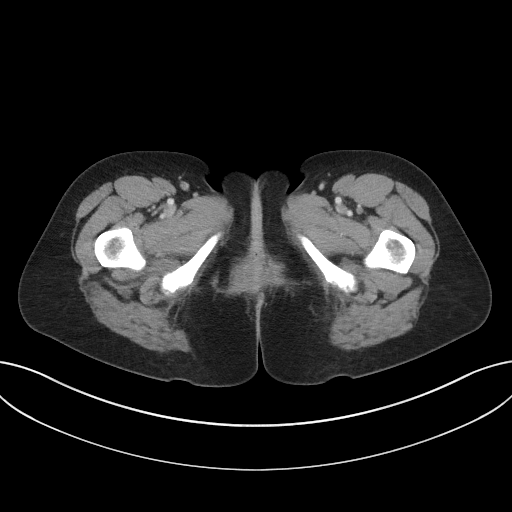
[im 6/91  bone]
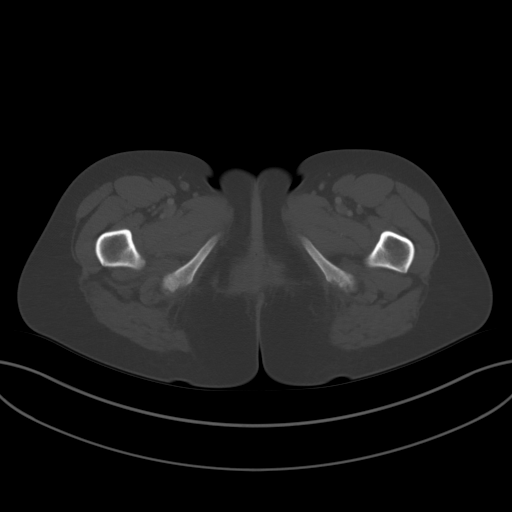
[im 11/91  soft-tissue]
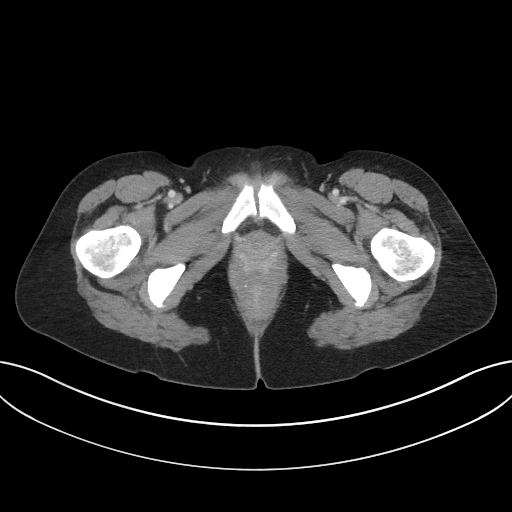
[im 21/91  soft-tissue]
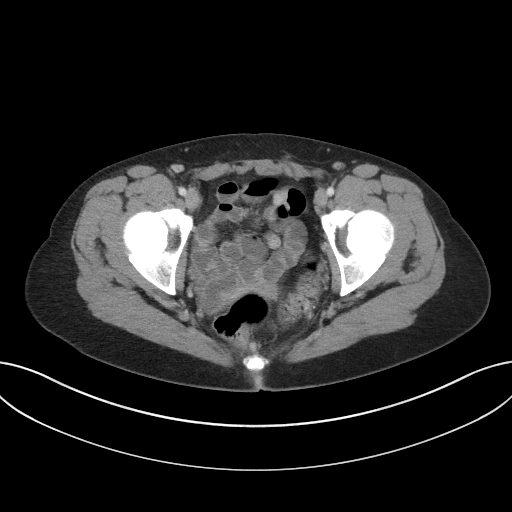
[im 26/91  soft-tissue]
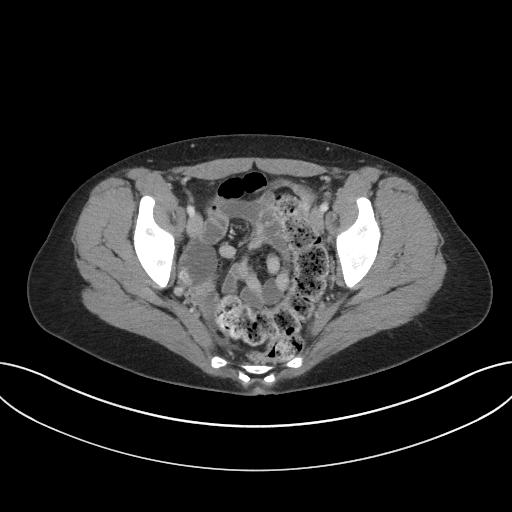
[im 31/91  soft-tissue]
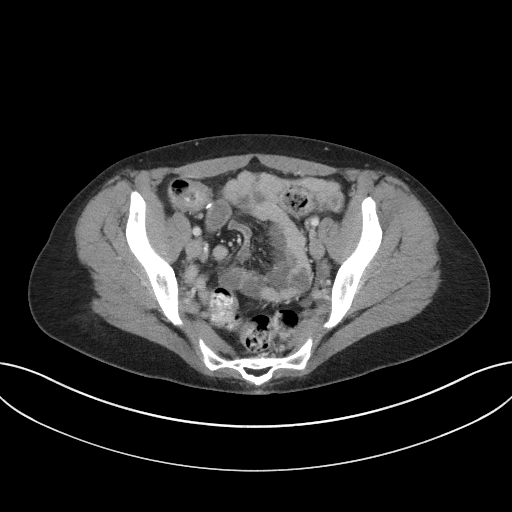
[im 41/91  soft-tissue]
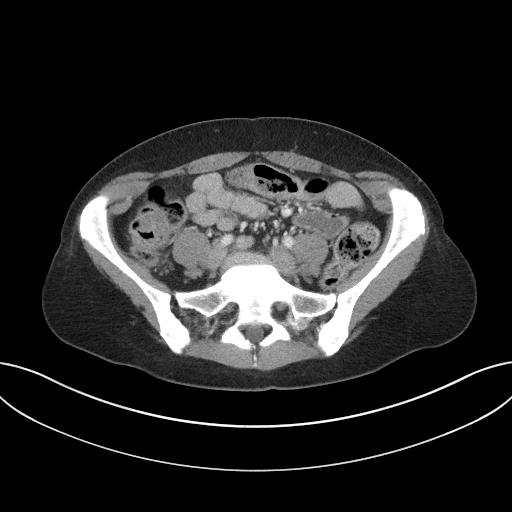
[im 46/91  soft-tissue]
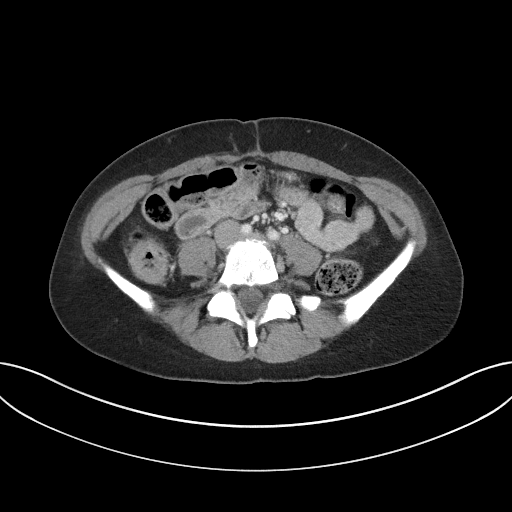
[im 51/91  soft-tissue]
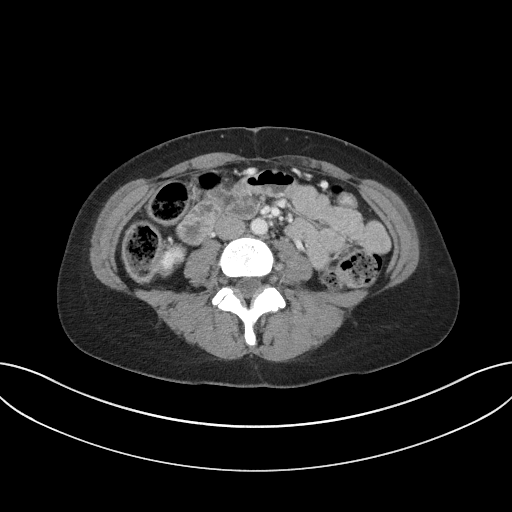
[im 61/91  soft-tissue]
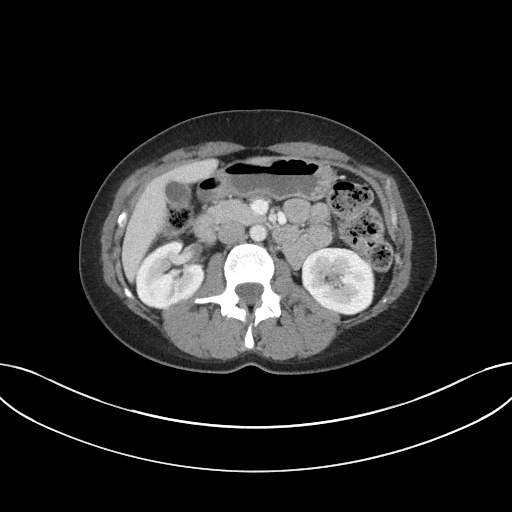
[im 61/91  bone]
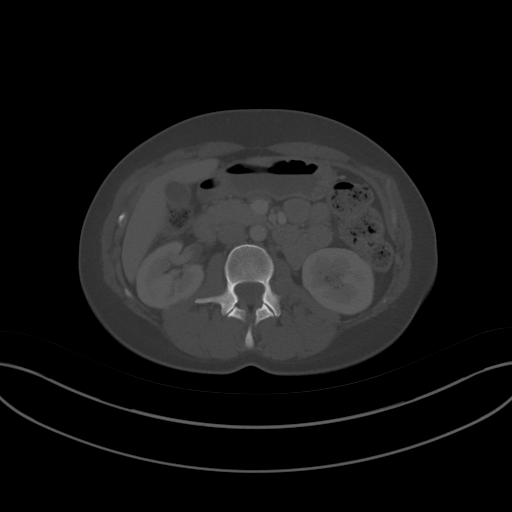
[im 66/91  soft-tissue]
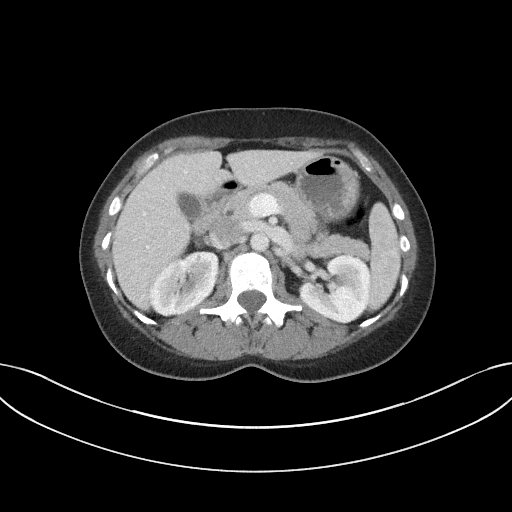
[im 71/91  soft-tissue]
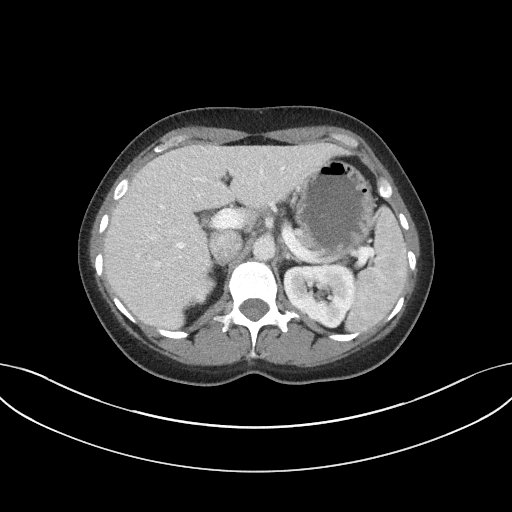
[im 81/91  soft-tissue]
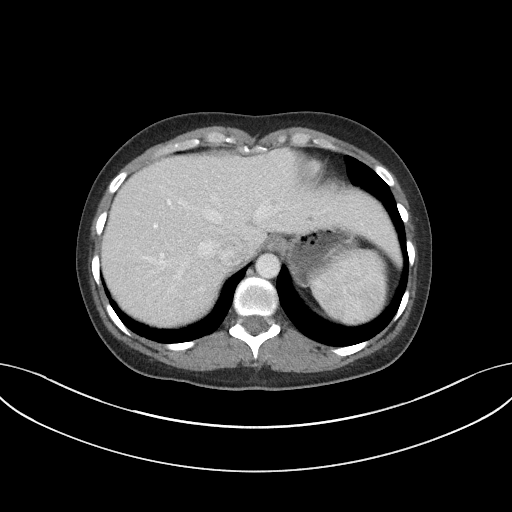
[im 86/91  soft-tissue]
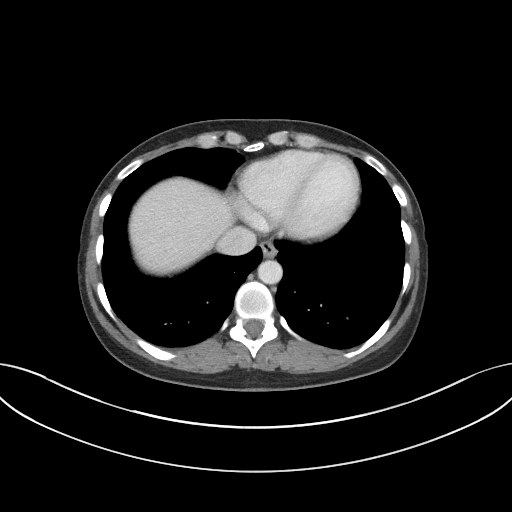

[Series 5: coronal st · coronal · 0.67mm/px · 3 of 82 slices shown]
[im 28/82  soft-tissue]
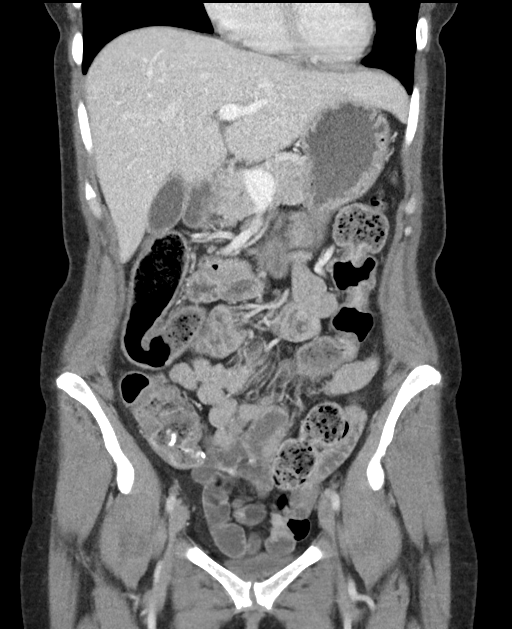
[im 37/82  soft-tissue]
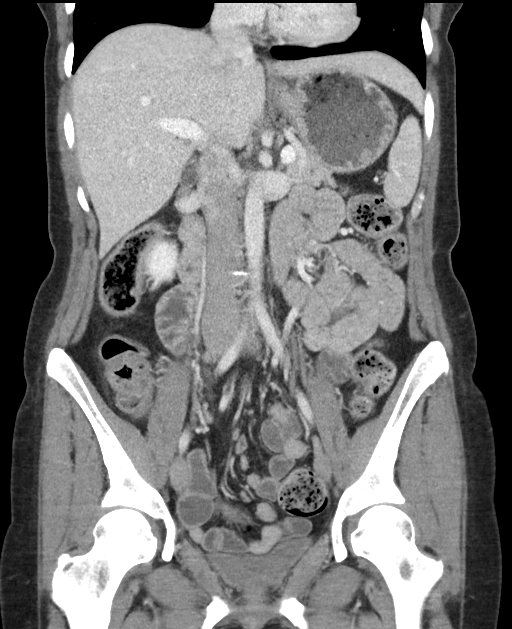
[im 46/82  soft-tissue]
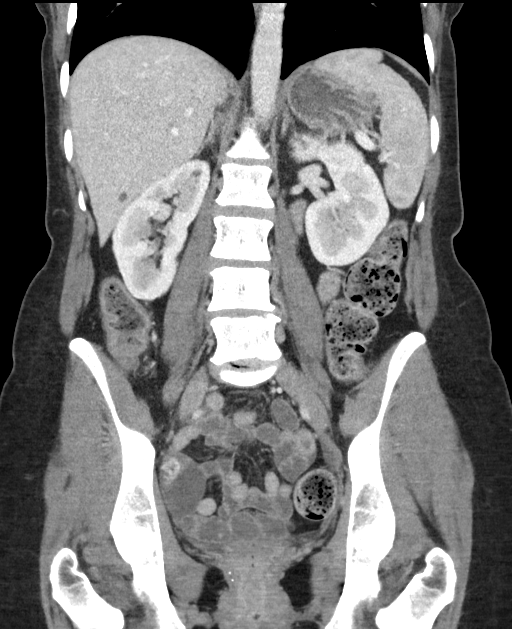

[16 of 46 positions shown; findings below may reference images not displayed]

FINDINGS: Lower chest: The visualized lung bases are clear.

No intra-abdominal free air or free fluid.

Hepatobiliary: Subcentimeter right hepatic hypodense focus is too
small to characterize. The liver is otherwise unremarkable. No
intrahepatic biliary dilatation. The gallbladder is unremarkable.

Pancreas: Unremarkable. No pancreatic ductal dilatation or
surrounding inflammatory changes.

Spleen: Normal in size without focal abnormality.

Adrenals/Urinary Tract: The adrenal glands unremarkable. There is no
hydronephrosis on either side. There is symmetric enhancement and
excretion of contrast by both kidneys. The visualized ureters and
urinary bladder appear unremarkable.

Stomach/Bowel: There is no bowel obstruction. Minimal haziness
adjacent to the ascending colon may be chronic. Clinical correlation
is recommended. Appendectomy.

Vascular/Lymphatic: The abdominal aorta and IVC are unremarkable. No
portal venous gas. There is no adenopathy.

Reproductive: Hysterectomy. There is a 15 mm corpus luteum in the
right ovary as well as a 3 cm cyst.

Other: None

Musculoskeletal: No acute or significant osseous findings.
IMPRESSION: 1. A 15 mm right ovarian corpus luteum and a 3 cm cyst. No follow-up
imaging recommended. note: This recommendation does not apply to
premenarchal patients and to those with increased risk (genetic,
family history, elevated tumor markers or other high-risk factors)
of ovarian cancer. Reference: JACR [DATE]):[id number]
2. No bowel obstruction. Minimal haziness adjacent to the ascending
colon may be chronic. Clinical correlation is recommended.
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
## 2022-06-22 ENCOUNTER — Ambulatory Visit: Payer: Medicaid Other | Attending: Physician Assistant | Admitting: Physician Assistant

## 2022-06-22 DIAGNOSIS — E039 Hypothyroidism, unspecified: Secondary | ICD-10-CM

## 2022-06-22 DIAGNOSIS — Z8659 Personal history of other mental and behavioral disorders: Secondary | ICD-10-CM

## 2022-06-22 DIAGNOSIS — M7989 Other specified soft tissue disorders: Secondary | ICD-10-CM

## 2022-06-22 DIAGNOSIS — M25472 Effusion, left ankle: Secondary | ICD-10-CM

## 2022-06-22 DIAGNOSIS — N3946 Mixed incontinence: Secondary | ICD-10-CM

## 2022-06-22 DIAGNOSIS — Z90710 Acquired absence of both cervix and uterus: Secondary | ICD-10-CM

## 2022-08-16 ENCOUNTER — Other Ambulatory Visit: Payer: Self-pay | Admitting: Family Medicine

## 2022-08-16 DIAGNOSIS — Z1231 Encounter for screening mammogram for malignant neoplasm of breast: Secondary | ICD-10-CM
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
## 2022-09-03 ENCOUNTER — Ambulatory Visit: Payer: Medicaid Other | Admitting: Physician Assistant

## 2022-09-03 DIAGNOSIS — E039 Hypothyroidism, unspecified: Secondary | ICD-10-CM

## 2022-09-03 DIAGNOSIS — M25475 Effusion, left foot: Secondary | ICD-10-CM

## 2022-09-03 DIAGNOSIS — N3946 Mixed incontinence: Secondary | ICD-10-CM

## 2022-09-03 DIAGNOSIS — Z90710 Acquired absence of both cervix and uterus: Secondary | ICD-10-CM

## 2022-09-03 DIAGNOSIS — Z8659 Personal history of other mental and behavioral disorders: Secondary | ICD-10-CM

## 2022-09-03 DIAGNOSIS — M7989 Other specified soft tissue disorders: Secondary | ICD-10-CM

## 2022-09-03 DIAGNOSIS — Z9049 Acquired absence of other specified parts of digestive tract: Secondary | ICD-10-CM

## 2022-10-08 ENCOUNTER — Ambulatory Visit: Payer: Medicaid Other

## 2022-10-18 ENCOUNTER — Ambulatory Visit: Payer: Medicaid Other

## 2022-11-29 ENCOUNTER — Ambulatory Visit: Payer: Medicaid Other

## 2022-12-01 ENCOUNTER — Ambulatory Visit: Payer: Medicaid Other

## 2022-12-15 ENCOUNTER — Encounter: Payer: Self-pay | Admitting: Physical Therapy

## 2022-12-15 ENCOUNTER — Other Ambulatory Visit: Payer: Self-pay

## 2022-12-15 ENCOUNTER — Ambulatory Visit: Payer: Medicaid Other | Attending: Physician Assistant | Admitting: Physical Therapy

## 2022-12-15 DIAGNOSIS — M6281 Muscle weakness (generalized): Secondary | ICD-10-CM | POA: Diagnosis present

## 2022-12-15 DIAGNOSIS — M5459 Other low back pain: Secondary | ICD-10-CM | POA: Insufficient documentation
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
## 2022-12-23 ENCOUNTER — Ambulatory Visit: Payer: Medicaid Other

## 2022-12-23 DIAGNOSIS — M6281 Muscle weakness (generalized): Secondary | ICD-10-CM

## 2022-12-23 DIAGNOSIS — M5459 Other low back pain: Secondary | ICD-10-CM | POA: Diagnosis not present
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
## 2022-12-29 ENCOUNTER — Ambulatory Visit: Payer: Medicaid Other

## 2023-01-05 ENCOUNTER — Ambulatory Visit: Payer: Medicaid Other | Admitting: Physical Therapy

## 2023-01-12 ENCOUNTER — Ambulatory Visit: Payer: Medicaid Other

## 2023-01-18 ENCOUNTER — Ambulatory Visit: Payer: Medicaid Other | Attending: Physician Assistant

## 2023-01-18 DIAGNOSIS — M5459 Other low back pain: Secondary | ICD-10-CM

## 2023-01-18 DIAGNOSIS — M6281 Muscle weakness (generalized): Secondary | ICD-10-CM
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
## 2023-02-08 ENCOUNTER — Ambulatory Visit: Payer: Medicaid Other | Attending: Physician Assistant

## 2023-02-15 ENCOUNTER — Ambulatory Visit: Payer: Medicaid Other

## 2023-02-28 ENCOUNTER — Ambulatory Visit: Payer: Medicaid Other

## 2023-05-30 ENCOUNTER — Ambulatory Visit: Payer: Medicaid Other

## 2023-06-20 ENCOUNTER — Emergency Department (HOSPITAL_BASED_OUTPATIENT_CLINIC_OR_DEPARTMENT_OTHER)
Admission: EM | Admit: 2023-06-20 | Discharge: 2023-06-21 | Discharge status: Against Medical Advice | Payer: Medicaid Other | Attending: Emergency Medicine | Admitting: Emergency Medicine

## 2023-06-20 ENCOUNTER — Other Ambulatory Visit: Payer: Self-pay

## 2023-06-20 ENCOUNTER — Encounter (HOSPITAL_BASED_OUTPATIENT_CLINIC_OR_DEPARTMENT_OTHER): Payer: Self-pay

## 2023-06-20 DIAGNOSIS — R1030 Lower abdominal pain, unspecified: Secondary | ICD-10-CM | POA: Insufficient documentation

## 2023-06-20 DIAGNOSIS — Z5321 Procedure and treatment not carried out due to patient leaving prior to being seen by health care provider: Secondary | ICD-10-CM | POA: Diagnosis not present

## 2023-06-20 DIAGNOSIS — R1010 Upper abdominal pain, unspecified: Secondary | ICD-10-CM | POA: Diagnosis present

## 2023-06-20 LAB — URINALYSIS, MICROSCOPIC (REFLEX): WBC, UA: NONE SEEN WBC/hpf (ref 0–5)

## 2023-06-20 LAB — URINALYSIS, ROUTINE W REFLEX MICROSCOPIC
Bilirubin Urine: NEGATIVE
Glucose, UA: NEGATIVE mg/dL
Ketones, ur: NEGATIVE mg/dL
Leukocytes,Ua: NEGATIVE
Nitrite: NEGATIVE
Protein, ur: NEGATIVE mg/dL
Specific Gravity, Urine: >=1.03 (ref 1.005–1.030)
pH: 5.5 (ref 5.0–8.0)

## 2023-06-20 LAB — COMPREHENSIVE METABOLIC PANEL
ALT: 13 U/L (ref 0–44)
AST: 13 U/L — ABNORMAL LOW (ref 15–41)
Albumin: 3.8 g/dL (ref 3.5–5.0)
Alkaline Phosphatase: 55 U/L (ref 38–126)
Anion gap: 9 (ref 5–15)
BUN: 13 mg/dL (ref 6–20)
CO2: 21 mmol/L — ABNORMAL LOW (ref 22–32)
Calcium: 8.9 mg/dL (ref 8.9–10.3)
Chloride: 106 mmol/L (ref 98–111)
Creatinine, Ser: 0.72 mg/dL (ref 0.44–1.00)
GFR, Estimated: >60 mL/min (ref >60)
Glucose, Bld: 129 mg/dL — ABNORMAL HIGH (ref 70–99)
Potassium: 3.4 mmol/L — ABNORMAL LOW (ref 3.5–5.1)
Sodium: 136 mmol/L (ref 135–145)
Total Bilirubin: 0.4 mg/dL (ref 0.3–1.2)
Total Protein: 6.8 g/dL (ref 6.5–8.1)

## 2023-06-20 LAB — CBC
HCT: 38 % (ref 36.0–46.0)
Hemoglobin: 12.8 g/dL (ref 12.0–15.0)
MCH: 30.8 pg (ref 26.0–34.0)
MCHC: 33.7 g/dL (ref 30.0–36.0)
MCV: 91.6 fL (ref 80.0–100.0)
Platelets: 197 10*3/uL (ref 150–400)
RBC: 4.15 MIL/uL (ref 3.87–5.11)
RDW: 12.6 % (ref 11.5–15.5)
WBC: 5.7 10*3/uL (ref 4.0–10.5)
nRBC: 0 % (ref 0.0–0.2)

## 2023-06-20 LAB — LIPASE, BLOOD: Lipase: 40 U/L (ref 11–51)

## 2023-06-20 NOTE — ED Triage Notes (Signed)
Complaining of abdominal pain for a month. Hurts upper and lower. Said she has known kidney stones and she has not had a bowel movement in 2-3 weeks. Supposed to see a gastroenterologist on Nov 18th

## 2024-05-14 ENCOUNTER — Ambulatory Visit

## 2024-05-30 ENCOUNTER — Ambulatory Visit

## 2024-06-07 ENCOUNTER — Ambulatory Visit

## 2024-06-14 ENCOUNTER — Ambulatory Visit

## 2024-10-03 ENCOUNTER — Other Ambulatory Visit: Payer: Self-pay | Admitting: Family Medicine

## 2024-10-03 DIAGNOSIS — Z1231 Encounter for screening mammogram for malignant neoplasm of breast: Secondary | ICD-10-CM

## 2024-10-23 ENCOUNTER — Ambulatory Visit

## 2024-10-31 ENCOUNTER — Ambulatory Visit
